# Patient Record
Sex: Female | Born: 1980 | Race: Black or African American | Hispanic: No | Marital: Single | Smoking: Never smoker
Health system: Southern US, Community
[De-identification: ages and names within clinical notes are randomized; demographics above are authoritative.]

## PROBLEM LIST (undated history)

## (undated) DIAGNOSIS — F419 Anxiety disorder, unspecified: Secondary | ICD-10-CM

## (undated) DIAGNOSIS — F410 Panic disorder [episodic paroxysmal anxiety] without agoraphobia: Secondary | ICD-10-CM

---
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
## 2001-09-30 ENCOUNTER — Encounter: Admission: RE | Admit: 2001-09-30 | Discharge: 2001-09-30 | Payer: Self-pay | Admitting: Sports Medicine

## 2002-04-06 ENCOUNTER — Emergency Department (HOSPITAL_COMMUNITY): Admission: EM | Admit: 2002-04-06 | Discharge: 2002-04-07 | Payer: Self-pay | Admitting: Emergency Medicine

## 2004-02-10 ENCOUNTER — Emergency Department (HOSPITAL_COMMUNITY): Admission: EM | Admit: 2004-02-10 | Discharge: 2004-02-10 | Payer: Self-pay

## 2005-01-25 ENCOUNTER — Emergency Department (HOSPITAL_COMMUNITY): Admission: EM | Admit: 2005-01-25 | Discharge: 2005-01-26 | Payer: Self-pay | Admitting: Emergency Medicine

## 2005-07-09 ENCOUNTER — Emergency Department (HOSPITAL_COMMUNITY): Admission: EM | Admit: 2005-07-09 | Discharge: 2005-07-09 | Payer: Self-pay | Admitting: Emergency Medicine

## 2005-07-12 ENCOUNTER — Emergency Department (HOSPITAL_COMMUNITY): Admission: EM | Admit: 2005-07-12 | Discharge: 2005-07-13 | Payer: Self-pay | Admitting: *Deleted

## 2005-11-07 ENCOUNTER — Emergency Department (HOSPITAL_COMMUNITY): Admission: EM | Admit: 2005-11-07 | Discharge: 2005-11-07 | Payer: Self-pay | Admitting: Emergency Medicine

## 2007-07-27 ENCOUNTER — Emergency Department (HOSPITAL_COMMUNITY): Admission: EM | Admit: 2007-07-27 | Discharge: 2007-07-27 | Payer: Self-pay | Admitting: Emergency Medicine

## 2008-06-17 ENCOUNTER — Emergency Department (HOSPITAL_COMMUNITY): Admission: EM | Admit: 2008-06-17 | Discharge: 2008-06-17 | Payer: Self-pay | Admitting: Emergency Medicine

## 2009-02-15 ENCOUNTER — Emergency Department (HOSPITAL_COMMUNITY): Admission: EM | Admit: 2009-02-15 | Discharge: 2009-02-15 | Payer: Self-pay | Admitting: Emergency Medicine

## 2009-02-17 ENCOUNTER — Inpatient Hospital Stay (HOSPITAL_COMMUNITY): Admission: AD | Admit: 2009-02-17 | Discharge: 2009-02-17 | Payer: Self-pay | Admitting: Obstetrics & Gynecology

## 2009-02-24 ENCOUNTER — Inpatient Hospital Stay (HOSPITAL_COMMUNITY): Admission: AD | Admit: 2009-02-24 | Discharge: 2009-02-24 | Payer: Self-pay | Admitting: Obstetrics & Gynecology

## 2009-02-26 ENCOUNTER — Inpatient Hospital Stay (HOSPITAL_COMMUNITY): Admission: AD | Admit: 2009-02-26 | Discharge: 2009-02-26 | Payer: Self-pay | Admitting: Obstetrics & Gynecology

## 2009-03-09 ENCOUNTER — Inpatient Hospital Stay (HOSPITAL_COMMUNITY): Admission: AD | Admit: 2009-03-09 | Discharge: 2009-03-09 | Payer: Self-pay | Admitting: Obstetrics & Gynecology

## 2009-03-30 ENCOUNTER — Encounter: Payer: Self-pay | Admitting: Obstetrics and Gynecology

## 2009-03-30 ENCOUNTER — Ambulatory Visit: Payer: Self-pay | Admitting: Obstetrics and Gynecology

## 2009-04-24 ENCOUNTER — Emergency Department (HOSPITAL_COMMUNITY): Admission: EM | Admit: 2009-04-24 | Discharge: 2009-04-24 | Payer: Self-pay | Admitting: Emergency Medicine

## 2009-09-06 ENCOUNTER — Emergency Department (HOSPITAL_COMMUNITY): Admission: EM | Admit: 2009-09-06 | Discharge: 2009-09-06 | Payer: Self-pay | Admitting: Emergency Medicine

## 2010-07-06 ENCOUNTER — Emergency Department (HOSPITAL_COMMUNITY): Admission: EM | Admit: 2010-07-06 | Discharge: 2009-10-07 | Payer: Self-pay | Admitting: Emergency Medicine

## 2010-07-30 ENCOUNTER — Emergency Department (HOSPITAL_COMMUNITY)
Admission: EM | Admit: 2010-07-30 | Discharge: 2010-07-31 | Payer: Self-pay | Source: Home / Self Care | Admitting: Emergency Medicine

## 2010-10-18 LAB — CBC
Hemoglobin: 12.8 g/dL (ref 12.0–15.0)
MCHC: 34.3 g/dL (ref 30.0–36.0)
RBC: 3.91 MIL/uL (ref 3.87–5.11)
RDW: 12.7 % (ref 11.5–15.5)

## 2010-10-18 LAB — POCT I-STAT, CHEM 8
BUN: 5 mg/dL — ABNORMAL LOW (ref 6–23)
Calcium, Ion: 1.13 mmol/L (ref 1.12–1.32)
Chloride: 107 mEq/L (ref 96–112)
HCT: 38 % (ref 36.0–46.0)
Potassium: 3.8 mEq/L (ref 3.5–5.1)

## 2010-10-18 LAB — URINALYSIS, ROUTINE W REFLEX MICROSCOPIC
Glucose, UA: NEGATIVE mg/dL
Ketones, ur: NEGATIVE mg/dL
Protein, ur: NEGATIVE mg/dL
pH: 5 (ref 5.0–8.0)

## 2010-10-18 LAB — DIFFERENTIAL
Basophils Absolute: 0 10*3/uL (ref 0.0–0.1)
Basophils Relative: 0 % (ref 0–1)
Lymphocytes Relative: 8 % — ABNORMAL LOW (ref 12–46)
Monocytes Absolute: 0.4 10*3/uL (ref 0.1–1.0)
Neutro Abs: 8.6 10*3/uL — ABNORMAL HIGH (ref 1.7–7.7)
Neutrophils Relative %: 87 % — ABNORMAL HIGH (ref 43–77)

## 2010-10-18 LAB — URINE MICROSCOPIC-ADD ON

## 2010-10-18 LAB — POCT PREGNANCY, URINE: Preg Test, Ur: NEGATIVE

## 2010-11-05 LAB — COMPREHENSIVE METABOLIC PANEL
Albumin: 3.5 g/dL (ref 3.5–5.2)
Alkaline Phosphatase: 51 U/L (ref 39–117)
BUN: 5 mg/dL — ABNORMAL LOW (ref 6–23)
CO2: 24 mEq/L (ref 19–32)
Chloride: 106 mEq/L (ref 96–112)
Creatinine, Ser: 0.61 mg/dL (ref 0.4–1.2)
GFR calc non Af Amer: >60 mL/min (ref >60)
Glucose, Bld: 92 mg/dL (ref 70–99)
Potassium: 3.4 mEq/L — ABNORMAL LOW (ref 3.5–5.1)
Total Bilirubin: 0.6 mg/dL (ref 0.3–1.2)

## 2010-11-05 LAB — URINE CULTURE: Colony Count: 7000

## 2010-11-05 LAB — HCG, QUANTITATIVE, PREGNANCY
hCG, Beta Chain, Quant, S: 297 m[IU]/mL — ABNORMAL HIGH (ref <5)
hCG, Beta Chain, Quant, S: 1114 m[IU]/mL — ABNORMAL HIGH (ref <5)
hCG, Beta Chain, Quant, S: 1155 m[IU]/mL — ABNORMAL HIGH (ref <5)
hCG, Beta Chain, Quant, S: 531 m[IU]/mL — ABNORMAL HIGH (ref <5)

## 2010-11-05 LAB — URINALYSIS, ROUTINE W REFLEX MICROSCOPIC
Nitrite: NEGATIVE
Protein, ur: NEGATIVE mg/dL
Urobilinogen, UA: 0.2 mg/dL (ref 0.0–1.0)

## 2010-11-05 LAB — CBC
HCT: 37.3 % (ref 36.0–46.0)
Hemoglobin: 12.7 g/dL (ref 12.0–15.0)
MCV: 94.7 fL (ref 78.0–100.0)
Platelets: 280 10*3/uL (ref 150–400)
RBC: 3.94 MIL/uL (ref 3.87–5.11)
RDW: 12.1 % (ref 11.5–15.5)
WBC: 6.3 10*3/uL (ref 4.0–10.5)
WBC: 8.8 10*3/uL (ref 4.0–10.5)

## 2010-11-05 LAB — DIFFERENTIAL
Basophils Absolute: 0 10*3/uL (ref 0.0–0.1)
Basophils Relative: 0 % (ref 0–1)
Lymphocytes Relative: 16 % (ref 12–46)
Monocytes Absolute: 0.8 10*3/uL (ref 0.1–1.0)
Neutro Abs: 4.2 10*3/uL (ref 1.7–7.7)
Neutrophils Relative %: 68 % (ref 43–77)

## 2010-11-05 LAB — URINE MICROSCOPIC-ADD ON

## 2010-11-05 LAB — LIPASE, BLOOD: Lipase: 16 U/L (ref 11–59)

## 2010-11-05 LAB — ABO/RH: ABO/RH(D): O POS

## 2010-11-05 LAB — WET PREP, GENITAL: Trich, Wet Prep: NONE SEEN

## 2010-12-12 NOTE — Group Therapy Note (Signed)
Traci Mccoy, PATTESON NO.:  [id number]   MEDICAL RECORD NO.:  [id number]          PATIENT TYPE:  WOC   LOCATION:  [facility]                   FACILITY:  [facility]   PHYSICIAN:  Argentina Donovan, MD        DATE OF BIRTH:  04/01/1981   DATE OF SERVICE:  03/30/2009                                  CLINIC NOTE   The patient is a 30 year old, gravida 1, para 0-0-1-0, who had a  spontaneous abortion on July 31 of this year had been followed for some  spotting and was approximately 4-1/[redacted] weeks gestation at that time.  Her  last beta-HCG was 1155 and she never had a followup.  She also pass good  tissue at home and never had a followup of her pathology.  She was not  on prenatal vitamins at the time.  I have talked to her about taking  them every day since she has refused contraception.  She has no plans  for getting pregnant in the near future,  but she does not want any  contraception and she says she does adjust the personal feeling that she  has.  We have talked about the ovarian cyst that she had which was done  on her first ultrasound was 6 cm and at last time it was followed up it  was sounded to some hemorrhagic cyst was probably and no reason to  follow that any further.   IMPRESSION:  Spontaneous abortion with a resolving ovarian cyst,  quantitative beta-HCG to be checked and the patient to start and stay on  permanently prenatal vitamins with at least 400 mcg of folic acid.           ______________________________  Argentina Donovan, MD     PR/MEDQ  D:  03/30/2009  T:  03/31/2009  Job:  [id number]

## 2011-05-01 LAB — URINALYSIS, ROUTINE W REFLEX MICROSCOPIC
Glucose, UA: NEGATIVE
Protein, ur: NEGATIVE
Specific Gravity, Urine: 1.016
Urobilinogen, UA: 0.2

## 2011-05-01 LAB — WET PREP, GENITAL: Clue Cells Wet Prep HPF POC: NONE SEEN

## 2011-05-01 LAB — URINE MICROSCOPIC-ADD ON

## 2011-05-01 LAB — GC/CHLAMYDIA PROBE AMP, GENITAL: Chlamydia, DNA Probe: NEGATIVE

## 2011-05-01 LAB — PREGNANCY, URINE: Preg Test, Ur: NEGATIVE

## 2011-05-01 LAB — RPR: RPR Ser Ql: NONREACTIVE

## 2011-11-10 ENCOUNTER — Emergency Department (HOSPITAL_COMMUNITY): Payer: 59

## 2011-11-10 ENCOUNTER — Emergency Department (HOSPITAL_COMMUNITY)
Admission: EM | Admit: 2011-11-10 | Discharge: 2011-11-10 | Disposition: A | Discharge status: Home / Self Care | Payer: 59 | Attending: Emergency Medicine | Admitting: Emergency Medicine

## 2011-11-10 ENCOUNTER — Encounter (HOSPITAL_COMMUNITY): Payer: Self-pay | Admitting: *Deleted

## 2011-11-10 DIAGNOSIS — R630 Anorexia: Secondary | ICD-10-CM | POA: Insufficient documentation

## 2011-11-10 DIAGNOSIS — F341 Dysthymic disorder: Secondary | ICD-10-CM | POA: Insufficient documentation

## 2011-11-10 DIAGNOSIS — R079 Chest pain, unspecified: Secondary | ICD-10-CM | POA: Insufficient documentation

## 2011-11-10 DIAGNOSIS — F419 Anxiety disorder, unspecified: Secondary | ICD-10-CM

## 2011-11-10 DIAGNOSIS — G4700 Insomnia, unspecified: Secondary | ICD-10-CM | POA: Insufficient documentation

## 2011-11-10 HISTORY — DX: Anxiety disorder, unspecified: F41.9

## 2011-11-10 HISTORY — DX: Panic disorder (episodic paroxysmal anxiety): F41.0

## 2011-11-10 MED ORDER — ALPRAZOLAM 0.25 MG PO TABS: 0.2500 mg | ORAL_TABLET | Freq: Every evening | ORAL | Status: AC | PRN

## 2011-11-10 NOTE — ED Provider Notes (Signed)
History     CSN: [id number]  Arrival date & time 11/10/11  1155   First MD Initiated Contact with Patient 11/10/11 1233      Chief Complaint  Patient presents with  . Panic Attack  . Depression    (Consider location/radiation/quality/duration/timing/severity/associated sxs/prior treatment) The history is provided by the patient.   Patient here with increased anxiety and chest pain x3 weeks. Chest pain is sharp and gets worse when she becomes more anxious. No pleuritic component to this. No leg swelling or dyspnea. Patient has been upset and due to the recent suicide of a friend and she feels responsible for this. She denies any suicidal or homicidal ideations. No auditory or visual hallucinations. No medications taken for this prior to arrival. Does admit to increased depression, anorexia, insomnia. No prior history of mental illness except for panic attacks Past Medical History  Diagnosis Date  . Anxiety   . Panic attacks   . Asthma   . Chronic bronchitis     History reviewed. No pertinent past surgical history.  History reviewed. No pertinent family history.  History  Substance Use Topics  . Smoking status: Never Smoker   . Smokeless tobacco: Never Used  . Alcohol Use: Yes     socially    OB History    Grav Para Term Preterm Abortions TAB SAB Ect Mult Living                  Review of Systems  All other systems reviewed and are negative.    Allergies  Food allergy formula  Home Medications   Current Outpatient Rx  Name Route Sig Dispense Refill  . DIPHENHYDRAMINE-APAP (SLEEP) 25-500 MG PO TABS Oral Take 1 tablet by mouth at bedtime as needed. Pain/sleep    . MULTIVITAMIN GUMMIES ADULT PO CHEW Oral Chew 1 tablet by mouth daily.      BP 100/64  Pulse 86  Temp(Src) 98.3 F (36.8 C) (Oral)  Resp 24  Ht 5\' 3"  (1.6 m)  Wt 135 lb (61.236 kg)  BMI 23.91 kg/m2  SpO2 100%  LMP 11/02/2011  Physical Exam  Nursing note and vitals  reviewed. Constitutional: She is oriented to person, place, and time. She appears well-developed and well-nourished.  Non-toxic appearance. No distress.  HENT:  Head: Normocephalic and atraumatic.  Eyes: Conjunctivae, EOM and lids are normal. Pupils are equal, round, and reactive to light.  Neck: Normal range of motion. Neck supple. No tracheal deviation present. No mass present.  Cardiovascular: Normal rate, regular rhythm and normal heart sounds.  Exam reveals no gallop.   No murmur heard. Pulmonary/Chest: Effort normal and breath sounds normal. No stridor. No respiratory distress. She has no decreased breath sounds. She has no wheezes. She has no rhonchi. She has no rales.  Abdominal: Soft. Normal appearance and bowel sounds are normal. She exhibits no distension. There is no tenderness. There is no rebound and no CVA tenderness.  Musculoskeletal: Normal range of motion. She exhibits no edema and no tenderness.  Neurological: She is alert and oriented to person, place, and time. She has normal strength. No cranial nerve deficit or sensory deficit. GCS eye subscore is 4. GCS verbal subscore is 5. GCS motor subscore is 6.  Skin: Skin is warm and dry. No abrasion and no rash noted.  Psychiatric: Her speech is normal. Her affect is blunt. She is slowed. Thought content is not paranoid and not delusional. She exhibits a depressed mood. She expresses no homicidal and  no suicidal ideation. She expresses no suicidal plans and no homicidal plans.    ED Course  Procedures (including critical care time)  Labs Reviewed - No data to display No results found.   No diagnosis found.    MDM  Pt seen by act and given outpt referrals --no si/hi here. Pt stable for d/c   Date: 11/10/2011  Rate: 86  Rhythm: normal sinus rhythm  QRS Axis: normal  Intervals: normal  ST/T Wave abnormalities: normal  Conduction Disutrbances:none  Narrative Interpretation:   Old EKG Reviewed: none  available          Toy Baker, MD 11/10/11 1401

## 2011-11-10 NOTE — BH Assessment (Signed)
Assessment Note   Traci Mccoy is an 31 y.o. female.   Pt is depressed and reports being "sad" about her female friend who spoke with her on the phone on 3/27 and said to pt "if you marry this other guy I will kill myself (per pt)."  Pt found out that this friend committed suicide on 3/27 after she and he hung up the phone.  Pt "feels" responsible but reports "I did not think he was serious and he gave me no impression he would actually do it.  I just thought he was making a point like some people do."  Pt verbalizes "feelings of guilt for my friend's death."  Pt denies any thoughts or intent to hurt self and denies hx of SI.  Pt denies family hx of SI.  Pt denies HI, AVH and reports she does not use alcohol or drugs.  Pt reports having a "lots of support and a good family."  Pt reports "my faith in God keeps me strong."  Pt is agreeable to optx referrals and pt was given referrals to [facility] and to [facility] in [place].  Pt reported she would follow up with these referrals on Monday and she would go to her parents house today after she left the ER.  Pt Ox3, eye contact was fair, insight was good, affect was flat and speech was soft but coherent.  Pt has no prior mental health hx per pt and works a FT job during the week.  Pt denied any abuse hx and verbalized she "feels safe and has lots of support in my life."  Pt and counselor discussed guilt feelings over her friends death and pt was able to put these feelings in their proper place and appeared to be able to advocate for herself effectively.    Pt will discharged home and Dr. Freida Busman is in agreement with dispo.  Axis I: Mood Disorder NOS Axis II: Deferred Axis III:  Past Medical History  Diagnosis Date  . Anxiety   . Panic attacks   . Asthma   . Chronic bronchitis    Axis IV: problems related to social environment Axis V: 51-60 moderate symptoms  Past Medical History:  Past Medical History  Diagnosis Date  . Anxiety    . Panic attacks   . Asthma   . Chronic bronchitis     History reviewed. No pertinent past surgical history.  Family History: History reviewed. No pertinent family history.  Social History:  reports that she has never smoked. She has never used smokeless tobacco. She reports that she drinks alcohol. She reports that she does not use illicit drugs.  Additional Social History:  Alcohol / Drug Use Pain Medications: none Prescriptions: none Over the Counter: none History of alcohol / drug use?: No history of alcohol / drug abuse Longest period of sobriety (when/how long): none Allergies:  Allergies  Allergen Reactions  . Food Allergy Formula Itching and Swelling    Fruits and vegetables    Home Medications:  No current facility-administered medications on file as of 11/10/2011.   Medications Prior to Admission  Medication Sig Dispense Refill  . diphenhydramine-acetaminophen (TYLENOL PM EXTRA STRENGTH) 25-500 MG TABS Take 1 tablet by mouth at bedtime as needed. Pain/sleep        OB/GYN Status:  Patient's last menstrual period was 11/02/2011.  General Assessment Data Location of Assessment: [facility] Living Arrangements: Alone Can pt return to current living arrangement?: Yes Admission Status: Voluntary Is  patient capable of signing voluntary admission?: Yes Transfer from: Acute Hospital Referral Source: MD     Risk to self Suicidal Ideation: No Suicidal Intent: No Is patient at risk for suicide?: No Suicidal Plan?: No Access to Means: No What has been your use of drugs/alcohol within the last 12 months?: no Previous Attempts/Gestures: No How many times?: 0  Other Self Harm Risks: 0 Triggers for Past Attempts: None known Intentional Self Injurious Behavior: None Family Suicide History: No Recent stressful life event(s): Trauma (Comment) (good friend committed suicide 3/27) Persecutory voices/beliefs?: No Depression: Yes Depression Symptoms:  Isolating;Fatigue;Guilt;Loss of interest in usual pleasures;Feeling worthless/self pity;Feeling angry/irritable Substance abuse history and/or treatment for substance abuse?: No Suicide prevention information given to non-admitted patients: Yes  Risk to Others Homicidal Ideation: No Thoughts of Harm to Others: No Current Homicidal Intent: No Current Homicidal Plan: No Access to Homicidal Means: No Identified Victim: 0 History of harm to others?: No Assessment of Violence: None Noted Violent Behavior Description: no Does patient have access to weapons?: No Criminal Charges Pending?: No Does patient have a court date: No  Psychosis Hallucinations: None noted Delusions: None noted  Mental Status Report Appear/Hygiene:  (pt casual) Eye Contact: Fair Motor Activity: Unremarkable Speech: Soft;Logical/coherent Level of Consciousness: Alert Mood: Depressed;Anxious;Preoccupied;Sad Affect: Anxious;Blunted;Depressed;Preoccupied;Sad Anxiety Level: Moderate Thought Processes: Coherent Judgement: Unimpaired Orientation: Person;Place;Time;Situation;Appropriate for developmental age Obsessive Compulsive Thoughts/Behaviors: None  Cognitive Functioning Concentration: Decreased Memory: Recent Intact;Remote Intact IQ: Average Insight: Fair Impulse Control: Fair Appetite: Poor Weight Loss: 10  Weight Gain: 0  Sleep: Decreased Total Hours of Sleep: 2  Vegetative Symptoms: None  Prior Inpatient Therapy Prior Inpatient Therapy: No Prior Therapy Dates: 0 Prior Therapy Facilty/Provider(s): 0 Reason for Treatment: 0  Prior Outpatient Therapy Prior Outpatient Therapy: No Prior Therapy Dates: 0 Prior Therapy Facilty/Provider(s): 0 Reason for Treatment: 0  ADL Screening (condition at time of admission) Patient's cognitive ability adequate to safely complete daily activities?: Yes Patient able to express need for assistance with ADLs?: Yes Independently performs ADLs?: Yes Weakness  of Legs: None Weakness of Arms/Hands: None  Home Assistive Devices/Equipment Home Assistive Devices/Equipment: None  Therapy Consults (therapy consults require a physician order) PT Evaluation Needed: No OT Evalulation Needed: No SLP Evaluation Needed: No Abuse/Neglect Assessment (Assessment to be complete while patient is alone) Physical Abuse: Denies Verbal Abuse: Denies Sexual Abuse: Denies Exploitation of patient/patient's resources: Denies Self-Neglect: Denies Values / Beliefs Cultural Requests During Hospitalization: None Spiritual Requests During Hospitalization: None Consults Spiritual Care Consult Needed: No Social Work Consult Needed: No Merchant navy officer (For Healthcare) Advance Directive: Patient does not have advance directive Pre-existing out of facility DNR order (yellow form or pink MOST form): No Nutrition Screen Diet: Regular Unintentional weight loss greater than 10lbs within the last month: Yes (Comment) (pt depressed and hasn't been eating well in 2 wks) Problems chewing or swallowing foods and/or liquids: No Home Tube Feeding or Total Parenteral Nutrition (TPN): No Patient appears severely malnourished: No Pregnant or Lactating: No  Additional Information 1:1 In Past 12 Months?: No CIRT Risk: No Elopement Risk: No Does patient have medical clearance?: Yes     Disposition:  Disposition Disposition of Patient: Outpatient treatment Type of outpatient treatment: Adult (recommend [facility] or [facility] counseling )  On Site Evaluation by:   Reviewed with Physician:     Titus Mould, Eppie Gibson 11/10/2011 2:31 PM

## 2011-11-10 NOTE — ED Notes (Addendum)
Pt from home with reports of feelings of depression, anxiety and sharp pains in chest off and on for 3 weeks. Pt also endorses having no appetite but denies cough, N/V/D. Pt tearful at present. Pt reports hx of panic attacks with similar feelings that she is reporting today. Pt reports that within the last 3 weeks a friend called her before they committed suicide.

## 2011-11-10 NOTE — Discharge Instructions (Signed)
Follow up as you have been instructed Anxiety and Panic Attacks Your caregiver has informed you that you are having an anxiety or panic attack. There may be many forms of this. Most of the time these attacks come suddenly and without warning. They come at any time of day, including periods of sleep, and at any time of life. They may be strong and unexplained. Although panic attacks are very scary, they are physically harmless. Sometimes the cause of your anxiety is not known. Anxiety is a protective mechanism of the body in its fight or flight mechanism. Most of these perceived danger situations are actually nonphysical situations (such as anxiety over losing a job). CAUSES  The causes of an anxiety or panic attack are many. Panic attacks may occur in otherwise healthy people given a certain set of circumstances. There may be a genetic cause for panic attacks. Some medications may also have anxiety as a side effect. SYMPTOMS  Some of the most common feelings are:  Intense terror.   Dizziness, feeling faint.   Hot and cold flashes.   Fear of going crazy.   Feelings that nothing is real.   Sweating.   Shaking.   Chest pain or a fast heartbeat (palpitations).   Smothering, choking sensations.   Feelings of impending doom and that death is near.   Tingling of extremities, this may be from over-breathing.   Altered reality (derealization).   Being detached from yourself (depersonalization).  Several symptoms can be present to make up anxiety or panic attacks. DIAGNOSIS  The evaluation by your caregiver will depend on the type of symptoms you are experiencing. The diagnosis of anxiety or panic attack is made when no physical illness can be determined to be a cause of the symptoms. TREATMENT  Treatment to prevent anxiety and panic attacks may include:  Avoidance of circumstances that cause anxiety.   Reassurance and relaxation.   Regular exercise.   Relaxation therapies, such as  yoga.   Psychotherapy with a psychiatrist or therapist.   Avoidance of caffeine, alcohol and illegal drugs.   Prescribed medication.  SEEK IMMEDIATE MEDICAL CARE IF:   You experience panic attack symptoms that are different than your usual symptoms.   You have any worsening or concerning symptoms.  Document Released: 07/16/2005 Document Revised: 07/05/2011 Document Reviewed: 11/17/2009 [facility] Patient Information 2012 [place], [place].

## 2012-01-21 ENCOUNTER — Emergency Department (HOSPITAL_COMMUNITY): Admission: EM | Admit: 2012-01-21 | Discharge: 2012-01-21 | Discharge status: Against Medical Advice | Payer: 59

## 2012-06-11 ENCOUNTER — Emergency Department (HOSPITAL_COMMUNITY): Payer: 59

## 2012-06-11 ENCOUNTER — Emergency Department (HOSPITAL_COMMUNITY)
Admission: EM | Admit: 2012-06-11 | Discharge: 2012-06-11 | Disposition: A | Discharge status: Home / Self Care | Payer: 59 | Attending: Emergency Medicine | Admitting: Emergency Medicine

## 2012-06-11 ENCOUNTER — Encounter (HOSPITAL_COMMUNITY): Payer: Self-pay | Admitting: Emergency Medicine

## 2012-06-11 DIAGNOSIS — Z8659 Personal history of other mental and behavioral disorders: Secondary | ICD-10-CM | POA: Insufficient documentation

## 2012-06-11 DIAGNOSIS — J4489 Other specified chronic obstructive pulmonary disease: Secondary | ICD-10-CM | POA: Insufficient documentation

## 2012-06-11 DIAGNOSIS — R1013 Epigastric pain: Secondary | ICD-10-CM | POA: Insufficient documentation

## 2012-06-11 DIAGNOSIS — R109 Unspecified abdominal pain: Secondary | ICD-10-CM

## 2012-06-11 DIAGNOSIS — J449 Chronic obstructive pulmonary disease, unspecified: Secondary | ICD-10-CM | POA: Insufficient documentation

## 2012-06-11 DIAGNOSIS — R42 Dizziness and giddiness: Secondary | ICD-10-CM | POA: Insufficient documentation

## 2012-06-11 LAB — URINALYSIS, ROUTINE W REFLEX MICROSCOPIC
Ketones, ur: NEGATIVE mg/dL
Leukocytes, UA: NEGATIVE
Nitrite: NEGATIVE
Specific Gravity, Urine: 1.023 (ref 1.005–1.030)
pH: 7 (ref 5.0–8.0)

## 2012-06-11 LAB — CBC WITH DIFFERENTIAL/PLATELET
Basophils Absolute: 0 10*3/uL (ref 0.0–0.1)
Basophils Relative: 0 % (ref 0–1)
MCHC: 34.3 g/dL (ref 30.0–36.0)
Neutro Abs: 4.1 10*3/uL (ref 1.7–7.7)
Neutrophils Relative %: 62 % (ref 43–77)
Platelets: 270 10*3/uL (ref 150–400)
RDW: 12.1 % (ref 11.5–15.5)
WBC: 6.6 10*3/uL (ref 4.0–10.5)

## 2012-06-11 LAB — HEPATIC FUNCTION PANEL
ALT: 12 U/L (ref 0–35)
AST: 17 U/L (ref 0–37)
Albumin: 4.6 g/dL (ref 3.5–5.2)
Alkaline Phosphatase: 68 U/L (ref 39–117)
Bilirubin, Direct: <0.1 mg/dL (ref 0.0–0.3)
Total Bilirubin: 0.4 mg/dL (ref 0.3–1.2)
Total Protein: 8.4 g/dL — ABNORMAL HIGH (ref 6.0–8.3)

## 2012-06-11 LAB — LIPASE, BLOOD: Lipase: 27 U/L (ref 11–59)

## 2012-06-11 LAB — BASIC METABOLIC PANEL
Chloride: 104 mEq/L (ref 96–112)
Creatinine, Ser: 0.63 mg/dL (ref 0.50–1.10)
GFR calc Af Amer: >90 mL/min (ref >90)
Potassium: 3.7 mEq/L (ref 3.5–5.1)
Sodium: 139 mEq/L (ref 135–145)

## 2012-06-11 LAB — URINE MICROSCOPIC-ADD ON

## 2012-06-11 MED ORDER — HYDROCODONE-ACETAMINOPHEN 5-325 MG PO TABS: 1.0000 | ORAL_TABLET | Freq: Four times a day (QID) | ORAL | Status: DC | PRN

## 2012-06-11 MED ORDER — SODIUM CHLORIDE 0.9 % IV BOLUS (SEPSIS)
1000.0000 mL | Freq: Once | INTRAVENOUS | Status: AC
  Administered 2012-06-11: 1000 mL via INTRAVENOUS

## 2012-06-11 NOTE — ED Provider Notes (Signed)
History     CSN: [id number]  Arrival date & time 06/11/12  1007   First MD Initiated Contact with Patient 06/11/12 1113      Chief Complaint  Patient presents with  . Dizziness  . Abdominal Pain    (Consider location/radiation/quality/duration/timing/severity/associated sxs/prior treatment) HPI Patient presents with abdominal pain mainly after eating. The patient states that she has epigastric pain after eating. The patient is stating that she took tylenol with some relief. The patient denies chest pain, SOB, vomiting, diarrhea, headache, fever, weakness, back pain, dysuria, or syncope. The patient is stating that this has been occuring for a while. The patient states that is has not been persistent.  Past Medical History  Diagnosis Date  . Anxiety   . Panic attacks   . Asthma   . Chronic bronchitis     History reviewed. No pertinent past surgical history.  History reviewed. No pertinent family history.  History  Substance Use Topics  . Smoking status: Never Smoker   . Smokeless tobacco: Never Used  . Alcohol Use: Yes     Comment: socially    OB History    Grav Para Term Preterm Abortions TAB SAB Ect Mult Living                  Review of Systems All other systems negative except as documented in the HPI. All pertinent positives and negatives as reviewed in the HPI.  Allergies  Food allergy formula  Home Medications   Current Outpatient Rx  Name  Route  Sig  Dispense  Refill  . ACETAMINOPHEN 325 MG PO TABS   Oral   Take 650-975 mg by mouth 2 (two) times daily as needed. For headaches         . SUMATRIPTAN SUCCINATE 25 MG PO TABS   Oral   Take 25 mg by mouth every 2 (two) hours as needed. For headaches           BP 138/86  Pulse 99  Temp 98.5 F (36.9 C) (Oral)  Resp 24  SpO2 99%  LMP 05/31/2012  Physical Exam  Nursing note and vitals reviewed. Constitutional: She appears well-developed and well-nourished.  HENT:  Head: Normocephalic and  atraumatic.  Mouth/Throat: Oropharynx is clear and moist.  Eyes: Pupils are equal, round, and reactive to light.  Neck: Normal range of motion. Neck supple.  Cardiovascular: Normal rate and regular rhythm.   Pulmonary/Chest: Effort normal and breath sounds normal.  Abdominal: Soft. Bowel sounds are normal. She exhibits no distension. There is tenderness. There is no rebound and no guarding.    Skin: Skin is warm and dry. No rash noted.    ED Course  Procedures (including critical care time)  Labs Reviewed  BASIC METABOLIC PANEL - Abnormal; Notable for the following:    Glucose, Bld 102 (*)     All other components within normal limits  URINALYSIS, ROUTINE W REFLEX MICROSCOPIC - Abnormal; Notable for the following:    Hgb urine dipstick MODERATE (*)     All other components within normal limits  URINE MICROSCOPIC-ADD ON - Abnormal; Notable for the following:    Squamous Epithelial / LPF FEW (*)     Bacteria, UA FEW (*)     All other components within normal limits  HEPATIC FUNCTION PANEL - Abnormal; Notable for the following:    Total Protein 8.4 (*)     All other components within normal limits  CBC WITH DIFFERENTIAL  POCT PREGNANCY, URINE  LIPASE, BLOOD   Ct Abdomen Pelvis Wo Contrast  06/11/2012  *RADIOLOGY REPORT*  Clinical Data: Abdominal pain  CT ABDOMEN AND PELVIS WITHOUT CONTRAST  Technique:  Multidetector CT imaging of the abdomen and pelvis was performed following the standard protocol without intravenous contrast.  Comparison: None.  Findings: Lung bases are clear.  No pleural or pericardial fluid. The liver has a normal appearance without focal lesions or biliary ductal dilatation.  There appears to be sludge within the gallbladder but no calcified stones.  No sign of gallbladder inflammation by CT.  The spleen is normal.  The pancreas is normal. The adrenal glands are normal.  The kidneys are normal.  No cyst, mass, stone or hydronephrosis.  The aorta and IVC are  normal.  No retroperitoneal mass or adenopathy.  No free but no fluid or air. Uterus and adnexal regions appear normal.  No significant bony finding.  IMPRESSION: Suggestion of sludge in the gallbladder.  Otherwise normal exam. No evidence of urinary tract pathology.   Original Report Authenticated By: Paulina Fusi, M.D.     Patient has signs of GB sludge on CT. The patient will have an [place]. The patient will be referred as needed.     MDM          Carlyle Dolly, PA-C 06/12/12 [phone]

## 2012-06-11 NOTE — ED Notes (Signed)
Pt c/o dizziness and abd pain x 3 days; pt appears very anxious at present; pt sts HA today as well

## 2012-06-11 NOTE — ED Notes (Signed)
Patient transported to CT 

## 2012-06-12 NOTE — ED Provider Notes (Signed)
Medical screening examination/treatment/procedure(s) were performed by non-physician practitioner and as supervising physician I was immediately available for consultation/collaboration.   Celene Kras, MD 06/12/12 [phone]

## 2012-07-12 ENCOUNTER — Encounter (HOSPITAL_COMMUNITY): Payer: Self-pay | Admitting: *Deleted

## 2012-07-12 ENCOUNTER — Emergency Department (HOSPITAL_COMMUNITY)
Admission: EM | Admit: 2012-07-12 | Discharge: 2012-07-12 | Disposition: A | Discharge status: Home / Self Care | Payer: 59 | Source: Home / Self Care | Attending: Emergency Medicine | Admitting: Emergency Medicine

## 2012-07-12 DIAGNOSIS — R6889 Other general symptoms and signs: Secondary | ICD-10-CM

## 2012-07-12 DIAGNOSIS — J111 Influenza due to unidentified influenza virus with other respiratory manifestations: Secondary | ICD-10-CM

## 2012-07-12 MED ORDER — ACETAMINOPHEN-CODEINE #3 300-30 MG PO TABS: 1.0000 | ORAL_TABLET | Freq: Four times a day (QID) | ORAL | Status: DC | PRN

## 2012-07-12 MED ORDER — OSELTAMIVIR PHOSPHATE 75 MG PO CAPS: 75.0000 mg | ORAL_CAPSULE | Freq: Two times a day (BID) | ORAL | Status: AC

## 2012-07-12 NOTE — ED Notes (Signed)
Vitals at 17:34 entered in error. Mw,cma

## 2012-07-12 NOTE — ED Notes (Signed)
Waiting discharge papers 

## 2012-07-12 NOTE — ED Notes (Signed)
Patient complains of body aches, head and chest congestion, fever/chills, sore throat and headaches x 1 day. Pt denies nausea, vomiting, diarrhea.

## 2012-07-12 NOTE — ED Provider Notes (Signed)
History     CSN: [id number]  Arrival date & time 07/12/12  1635   First MD Initiated Contact with Patient 07/12/12 1640      Chief Complaint  Patient presents with  . URI    (Consider location/radiation/quality/duration/timing/severity/associated sxs/prior treatment) HPI Comments: Patient presents as urgent care complaining of a one-day history of body aches, headaches cough mainly dry runny and congested nose. Some discomfort when she swallows and feeling rundown. 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 Denies any vomiting abdominal pain or nausea. Denies any shortness of breath. Or diarrheas  Patient is a 30 y.o. female presenting with URI. The history is provided by the patient.  URI The primary symptoms include fever, fatigue, headaches, sore throat, cough, myalgias and arthralgias. Primary symptoms do not include ear pain, wheezing, abdominal pain, vomiting or rash. The current episode started yesterday. This is a new problem.  The headache is not associated with neck stiffness.  The sore throat is not accompanied by stridor.  Symptoms associated with the illness include chills, congestion and rhinorrhea.    Past Medical History  Diagnosis Date  . Anxiety   . Panic attacks   . Asthma   . Chronic bronchitis     History reviewed. No pertinent past surgical history.  History reviewed. No pertinent family history.  History  Substance Use Topics  . Smoking status: Never Smoker   . Smokeless tobacco: Never Used  . Alcohol Use: Yes     Comment: socially    OB History    Grav Para Term Preterm Abortions TAB SAB Ect Mult Living                  Review of Systems  Constitutional: Positive for fever, chills, activity change and fatigue.  HENT: Positive for congestion, sore throat and rhinorrhea. Negative for ear pain, neck pain and neck stiffness.   Respiratory: Positive for cough. Negative for chest tightness, shortness of breath,  wheezing and stridor.   Gastrointestinal: Negative for vomiting and abdominal pain.  Musculoskeletal: Positive for myalgias and arthralgias.  Skin: Negative for rash.  Neurological: Positive for headaches. Negative for dizziness.    Allergies  Food allergy formula  Home Medications   Current Outpatient Rx  Name  Route  Sig  Dispense  Refill  . ACETAMINOPHEN 325 MG PO TABS   Oral   Take 650-975 mg by mouth 2 (two) times daily as needed. For headaches         . ACETAMINOPHEN-CODEINE #3 300-30 MG PO TABS   Oral   Take 1-2 tablets by mouth every 6 (six) hours as needed for pain.   15 tablet   0   . OSELTAMIVIR PHOSPHATE 75 MG PO CAPS   Oral   Take 1 capsule (75 mg total) by mouth every 12 (twelve) hours.   10 capsule   0   . SUMATRIPTAN SUCCINATE 25 MG PO TABS   Oral   Take 25 mg by mouth every 2 (two) hours as needed. For headaches           BP 123/82  Pulse 99  Temp 98.5 F (36.9 C) (Oral)  Resp 16  SpO2 100%  LMP 07/01/2012  Physical Exam  Vitals reviewed. Constitutional: She is oriented to person, place, and time. Vital signs are normal. She appears well-developed and well-nourished.  Non-toxic appearance. She does not have a sickly appearance. She appears ill. No distress.  HENT:  Head: Normocephalic.  Right Ear: Tympanic membrane normal.  Left Ear: Tympanic membrane normal.  Nose: Rhinorrhea present.  Mouth/Throat: Uvula is midline and mucous membranes are normal. Posterior oropharyngeal erythema present. No oropharyngeal exudate.  Eyes: Conjunctivae normal and EOM are normal. Pupils are equal, round, and reactive to light. Right eye exhibits no discharge. Left eye exhibits no discharge.  Neck: Normal range of motion. Neck supple. No JVD present.  Cardiovascular: Normal rate.  Exam reveals no gallop and no friction rub.   No murmur heard. Pulmonary/Chest: Effort normal and breath sounds normal. No respiratory distress. She has no wheezes. She has no  rales. She exhibits no tenderness.  Neurological: She is alert and oriented to person, place, and time.  Skin: No rash noted. No erythema.    ED Course  Procedures (including critical care time)   Labs Reviewed  POCT RAPID STREP A ([facility] ONLY)   No results found.   1. Influenza-like symptoms    Negative strep test   MDM  Influenza-like illness. Patient in no respiratory distress. Will propose Tamiflu for 5 days and Tylenol #3. Rest and hydrate. Discuss symptoms that should warrant her term further evaluation. She agrees with treatment plan and followup care.        Jimmie Molly, MD 07/12/12 1739

## 2013-06-04 ENCOUNTER — Other Ambulatory Visit: Payer: Self-pay

## 2013-07-16 ENCOUNTER — Encounter (HOSPITAL_COMMUNITY): Payer: Self-pay | Admitting: Emergency Medicine

## 2013-07-16 ENCOUNTER — Emergency Department (HOSPITAL_COMMUNITY)
Admission: EM | Admit: 2013-07-16 | Discharge: 2013-07-16 | Disposition: A | Discharge status: Home / Self Care | Payer: 59 | Source: Home / Self Care | Attending: Family Medicine | Admitting: Family Medicine

## 2013-07-16 DIAGNOSIS — J069 Acute upper respiratory infection, unspecified: Secondary | ICD-10-CM

## 2013-07-16 MED ORDER — IPRATROPIUM BROMIDE 0.06 % NA SOLN: 2.0000 | Freq: Four times a day (QID) | NASAL | Status: DC

## 2013-07-16 NOTE — ED Provider Notes (Signed)
CSN: [id number]     Arrival date & time 07/16/13  1637 History   First MD Initiated Contact with Patient 07/16/13 1753     Chief Complaint  Patient presents with  . URI   (Consider location/radiation/quality/duration/timing/severity/associated sxs/prior Treatment) Patient is a 32 y.o. female presenting with URI. The history is provided by the patient.  URI Presenting symptoms: congestion, cough and rhinorrhea   Presenting symptoms: no facial pain, no fever and no sore throat   Severity:  Mild Onset quality:  Gradual Duration:  2 weeks Progression:  Unchanged Chronicity:  New Ineffective treatments:  OTC medications Associated symptoms: no sinus pain and no wheezing   Risk factors: recent travel and sick contacts     Past Medical History  Diagnosis Date  . Anxiety   . Panic attacks   . Asthma   . Chronic bronchitis    History reviewed. No pertinent past surgical history. No family history on file. History  Substance Use Topics  . Smoking status: Never Smoker   . Smokeless tobacco: Never Used  . Alcohol Use: Yes     Comment: socially   OB History   Grav Para Term Preterm Abortions TAB SAB Ect Mult Living                 Review of Systems  Constitutional: Negative.  Negative for fever.  HENT: Positive for congestion and rhinorrhea. Negative for sinus pressure and sore throat.   Respiratory: Positive for cough. Negative for wheezing.     Allergies  Food allergy formula  Home Medications   Current Outpatient Rx  Name  Route  Sig  Dispense  Refill  . dextromethorphan-guaiFENesin (MUCINEX DM) 30-600 MG per 12 hr tablet   Oral   Take 1 tablet by mouth 2 (two) times daily.         Marland Kitchen acetaminophen (TYLENOL) 325 MG tablet   Oral   Take 650-975 mg by mouth 2 (two) times daily as needed. For headaches         . acetaminophen-codeine (TYLENOL #3) 300-30 MG per tablet   Oral   Take 1-2 tablets by mouth every 6 (six) hours as needed for pain.   15 tablet   0   . ipratropium (ATROVENT) 0.06 % nasal spray   Nasal   Place 2 sprays into the nose 4 (four) times daily.   15 mL   1   . SUMAtriptan (IMITREX) 25 MG tablet   Oral   Take 25 mg by mouth every 2 (two) hours as needed. For headaches          LMP 06/23/2013 Physical Exam  Nursing note and vitals reviewed. Constitutional: She is oriented to person, place, and time. She appears well-developed and well-nourished.  HENT:  Head: Normocephalic.  Right Ear: External ear normal.  Left Ear: External ear normal.  Nose: Nose normal.  Mouth/Throat: Oropharynx is clear and moist.  Eyes: Conjunctivae are normal. Pupils are equal, round, and reactive to light.  Neck: Normal range of motion. Neck supple.  Cardiovascular: Normal rate and regular rhythm.   Pulmonary/Chest: Breath sounds normal.  Lymphadenopathy:    She has no cervical adenopathy.  Neurological: She is alert and oriented to person, place, and time.  Skin: Skin is warm and dry.    ED Course  Procedures (including critical care time) Labs Review Labs Reviewed - No data to display Imaging Review No results found.  EKG Interpretation    Date/Time:    Ventricular Rate:  PR Interval:    QRS Duration:   QT Interval:    QTC Calculation:   R Axis:     Text Interpretation:              MDM      Linna Hoff, MD 07/16/13 1806

## 2013-07-16 NOTE — ED Notes (Signed)
Nasal stuffiness and runny nose, cough, onset 12/6.  Saw pcp and was prescribed antibiotic-took 5 days and stopped meds.  Has been to [place] and returned to [place].  No improvement in symptoms

## 2013-08-10 ENCOUNTER — Encounter (HOSPITAL_COMMUNITY): Payer: Self-pay | Admitting: Emergency Medicine

## 2013-08-10 ENCOUNTER — Emergency Department (HOSPITAL_COMMUNITY)
Admission: EM | Admit: 2013-08-10 | Discharge: 2013-08-10 | Disposition: A | Discharge status: Home / Self Care | Payer: 59 | Source: Home / Self Care | Attending: Family Medicine | Admitting: Family Medicine

## 2013-08-10 DIAGNOSIS — N390 Urinary tract infection, site not specified: Secondary | ICD-10-CM

## 2013-08-10 LAB — POCT URINALYSIS DIP (DEVICE)
BILIRUBIN URINE: NEGATIVE
GLUCOSE, UA: NEGATIVE mg/dL
Ketones, ur: NEGATIVE mg/dL
NITRITE: POSITIVE — AB
Protein, ur: 30 mg/dL — AB
Specific Gravity, Urine: >=1.03 (ref 1.005–1.030)
UROBILINOGEN UA: 0.2 mg/dL (ref 0.0–1.0)
pH: 6 (ref 5.0–8.0)

## 2013-08-10 LAB — POCT PREGNANCY, URINE: Preg Test, Ur: NEGATIVE

## 2013-08-10 MED ORDER — CEPHALEXIN 500 MG PO CAPS: 500.0000 mg | ORAL_CAPSULE | Freq: Four times a day (QID) | ORAL | Status: DC

## 2013-08-10 MED ORDER — FLUCONAZOLE 150 MG PO TABS: 150.0000 mg | ORAL_TABLET | Freq: Once | ORAL | Status: DC

## 2013-08-10 NOTE — ED Notes (Signed)
C/o poss UTI recurrence

## 2013-08-10 NOTE — ED Provider Notes (Signed)
CSN: [id number]     Arrival date & time 08/10/13  1726 History   First MD Initiated Contact with Patient 08/10/13 1842     Chief Complaint  Patient presents with  . Urinary Tract Infection   (Consider location/radiation/quality/duration/timing/severity/associated sxs/prior Treatment) Patient is a 33 y.o. female presenting with urinary tract infection. The history is provided by the patient.  Urinary Tract Infection This is a new problem. The current episode started yesterday. The problem has been gradually worsening. Pertinent negatives include no chest pain and no abdominal pain.    Past Medical History  Diagnosis Date  . Anxiety   . Panic attacks   . Asthma   . Chronic bronchitis    History reviewed. No pertinent past surgical history. History reviewed. No pertinent family history. History  Substance Use Topics  . Smoking status: Never Smoker   . Smokeless tobacco: Never Used  . Alcohol Use: Yes     Comment: socially   OB History   Grav Para Term Preterm Abortions TAB SAB Ect Mult Living                 Review of Systems  Constitutional: Negative.  Negative for fever.  Cardiovascular: Negative for chest pain.  Gastrointestinal: Negative.  Negative for abdominal pain.  Genitourinary: Positive for dysuria, urgency and frequency.    Allergies  Food allergy formula  Home Medications   Current Outpatient Rx  Name  Route  Sig  Dispense  Refill  . acetaminophen (TYLENOL) 325 MG tablet   Oral   Take 650-975 mg by mouth 2 (two) times daily as needed. For headaches         . acetaminophen-codeine (TYLENOL #3) 300-30 MG per tablet   Oral   Take 1-2 tablets by mouth every 6 (six) hours as needed for pain.   15 tablet   0   . cephALEXin (KEFLEX) 500 MG capsule   Oral   Take 1 capsule (500 mg total) by mouth 4 (four) times daily. Take all of medicine and drink lots of fluids   20 capsule   0   . dextromethorphan-guaiFENesin (MUCINEX DM) 30-600 MG per 12 hr  tablet   Oral   Take 1 tablet by mouth 2 (two) times daily.         . fluconazole (DIFLUCAN) 150 MG tablet   Oral   Take 1 tablet (150 mg total) by mouth once. As needed for yeast infection   1 tablet   1   . ipratropium (ATROVENT) 0.06 % nasal spray   Nasal   Place 2 sprays into the nose 4 (four) times daily.   15 mL   1   . SUMAtriptan (IMITREX) 25 MG tablet   Oral   Take 25 mg by mouth every 2 (two) hours as needed. For headaches          BP 108/58  Pulse 81  Temp(Src) 98 F (36.7 C) (Oral)  Resp 16  SpO2 100%  LMP 06/23/2013 Physical Exam  Nursing note and vitals reviewed. Constitutional: She is oriented to person, place, and time. She appears well-developed and well-nourished. No distress.  Abdominal: Soft. Bowel sounds are normal. She exhibits no distension and no mass. There is tenderness in the suprapubic area. There is no rigidity, no rebound and no guarding.  Neurological: She is alert and oriented to person, place, and time.  Skin: Skin is warm and dry.    ED Course  Procedures (including critical care time) Labs  Review Labs Reviewed  POCT URINALYSIS DIP (DEVICE) - Abnormal; Notable for the following:    Hgb urine dipstick LARGE (*)    Protein, ur 30 (*)    Nitrite POSITIVE (*)    Leukocytes, UA SMALL (*)    All other components within normal limits  POCT PREGNANCY, URINE   Imaging Review No results found.  EKG Interpretation    Date/Time:    Ventricular Rate:    PR Interval:    QRS Duration:   QT Interval:    QTC Calculation:   R Axis:     Text Interpretation:              MDM  U/a abnl.    Linna Hoff, MD 08/10/13 Jerene Bears

## 2014-08-10 ENCOUNTER — Emergency Department (HOSPITAL_COMMUNITY)
Admission: EM | Admit: 2014-08-10 | Discharge: 2014-08-10 | Disposition: A | Discharge status: Home / Self Care | Payer: 59 | Attending: Emergency Medicine | Admitting: Emergency Medicine

## 2014-08-10 ENCOUNTER — Emergency Department (HOSPITAL_COMMUNITY): Payer: 59

## 2014-08-10 DIAGNOSIS — J45909 Unspecified asthma, uncomplicated: Secondary | ICD-10-CM | POA: Diagnosis not present

## 2014-08-10 DIAGNOSIS — Z3202 Encounter for pregnancy test, result negative: Secondary | ICD-10-CM | POA: Insufficient documentation

## 2014-08-10 DIAGNOSIS — Z8659 Personal history of other mental and behavioral disorders: Secondary | ICD-10-CM | POA: Diagnosis not present

## 2014-08-10 DIAGNOSIS — G8929 Other chronic pain: Secondary | ICD-10-CM | POA: Insufficient documentation

## 2014-08-10 DIAGNOSIS — Z792 Long term (current) use of antibiotics: Secondary | ICD-10-CM | POA: Diagnosis not present

## 2014-08-10 DIAGNOSIS — Z8742 Personal history of other diseases of the female genital tract: Secondary | ICD-10-CM | POA: Diagnosis not present

## 2014-08-10 DIAGNOSIS — R102 Pelvic and perineal pain: Secondary | ICD-10-CM | POA: Diagnosis not present

## 2014-08-10 DIAGNOSIS — R109 Unspecified abdominal pain: Secondary | ICD-10-CM | POA: Diagnosis present

## 2014-08-10 LAB — COMPREHENSIVE METABOLIC PANEL
ALBUMIN: 4.2 g/dL (ref 3.5–5.2)
ALT: 20 U/L (ref 0–35)
AST: 21 U/L (ref 0–37)
Alkaline Phosphatase: 69 U/L (ref 39–117)
Anion gap: 6 (ref 5–15)
BUN: 8 mg/dL (ref 6–23)
CO2: 24 mmol/L (ref 19–32)
Calcium: 9.2 mg/dL (ref 8.4–10.5)
Chloride: 106 mEq/L (ref 96–112)
Creatinine, Ser: 0.72 mg/dL (ref 0.50–1.10)
GFR calc Af Amer: >90 mL/min (ref >90)
GFR calc non Af Amer: >90 mL/min (ref >90)
Glucose, Bld: 98 mg/dL (ref 70–99)
Potassium: 3.8 mmol/L (ref 3.5–5.1)
SODIUM: 136 mmol/L (ref 135–145)
TOTAL PROTEIN: 7.7 g/dL (ref 6.0–8.3)
Total Bilirubin: 0.6 mg/dL (ref 0.3–1.2)

## 2014-08-10 LAB — CBC WITH DIFFERENTIAL/PLATELET
BASOS ABS: 0 10*3/uL (ref 0.0–0.1)
Basophils Relative: 0 % (ref 0–1)
EOS ABS: 0.2 10*3/uL (ref 0.0–0.7)
EOS PCT: 2 % (ref 0–5)
HCT: 40.4 % (ref 36.0–46.0)
Hemoglobin: 13.7 g/dL (ref 12.0–15.0)
Lymphocytes Relative: 32 % (ref 12–46)
Lymphs Abs: 2.4 10*3/uL (ref 0.7–4.0)
MCH: 32.4 pg (ref 26.0–34.0)
MCHC: 33.9 g/dL (ref 30.0–36.0)
MCV: 95.5 fL (ref 78.0–100.0)
MONO ABS: 0.6 10*3/uL (ref 0.1–1.0)
Monocytes Relative: 8 % (ref 3–12)
Neutro Abs: 4.4 10*3/uL (ref 1.7–7.7)
Neutrophils Relative %: 58 % (ref 43–77)
Platelets: 281 10*3/uL (ref 150–400)
RBC: 4.23 MIL/uL (ref 3.87–5.11)
RDW: 12.5 % (ref 11.5–15.5)
WBC: 7.6 10*3/uL (ref 4.0–10.5)

## 2014-08-10 LAB — URINE MICROSCOPIC-ADD ON

## 2014-08-10 LAB — WET PREP, GENITAL
Clue Cells Wet Prep HPF POC: NONE SEEN
TRICH WET PREP: NONE SEEN
Yeast Wet Prep HPF POC: NONE SEEN

## 2014-08-10 LAB — URINALYSIS, ROUTINE W REFLEX MICROSCOPIC
Bilirubin Urine: NEGATIVE
Glucose, UA: NEGATIVE mg/dL
KETONES UR: NEGATIVE mg/dL
Leukocytes, UA: NEGATIVE
NITRITE: NEGATIVE
PROTEIN: NEGATIVE mg/dL
Specific Gravity, Urine: 1.024 (ref 1.005–1.030)
UROBILINOGEN UA: 0.2 mg/dL (ref 0.0–1.0)
pH: 6 (ref 5.0–8.0)

## 2014-08-10 LAB — POC URINE PREG, ED: PREG TEST UR: NEGATIVE

## 2014-08-10 MED ORDER — TRAMADOL HCL 50 MG PO TABS: 50.0000 mg | ORAL_TABLET | Freq: Four times a day (QID) | ORAL | Status: DC | PRN

## 2014-08-10 NOTE — ED Provider Notes (Signed)
CSN: [id number]     Arrival date & time 08/10/14  1727 History   First MD Initiated Contact with Patient 08/10/14 1836     Chief Complaint  Patient presents with  . Abdominal Pain     (Consider location/radiation/quality/duration/timing/severity/associated sxs/prior Treatment) HPI Comments: Patient with history of chronic pelvic pain, previous ovarian cysts, presents with worse pain over past several months. Pain is sharp, non-radiating. It switches sides but is on both sides. No treatments prior to arrival. Last menstrual period was one month ago, normal for patient in duration and amount. No current vaginal discharge. She has had ultrasounds in the past. Recent Pap smear in 05/2014. No dysuria or back pain. No fever, diarrhea, nausea or vomiting. The course is constant. Aggravating factors: none. Alleviating factors: none.     Patient is a 34 y.o. female presenting with abdominal pain. The history is provided by the patient and medical records.  Abdominal Pain Associated symptoms: no chest pain, no cough, no diarrhea, no dysuria, no fever, no nausea, no sore throat, no vaginal bleeding, no vaginal discharge and no vomiting     Past Medical History  Diagnosis Date  . Anxiety   . Panic attacks   . Asthma   . Chronic bronchitis    No past surgical history on file. No family history on file. History  Substance Use Topics  . Smoking status: Never Smoker   . Smokeless tobacco: Never Used  . Alcohol Use: Yes     Comment: socially   OB History    No data available     Review of Systems  Constitutional: Negative for fever.  HENT: Negative for rhinorrhea and sore throat.   Eyes: Negative for redness.  Respiratory: Negative for cough.   Cardiovascular: Negative for chest pain.  Gastrointestinal: Positive for abdominal pain. Negative for nausea, vomiting and diarrhea.  Genitourinary: Positive for pelvic pain. Negative for dysuria, vaginal bleeding and vaginal discharge.   Musculoskeletal: Negative for myalgias.  Skin: Negative for rash.  Neurological: Negative for headaches.    Allergies  Food allergy formula  Home Medications   Prior to Admission medications   Medication Sig Start Date End Date Taking? Authorizing Provider  acetaminophen (TYLENOL) 325 MG tablet Take 650-975 mg by mouth 2 (two) times daily as needed. For headaches    Historical Provider, MD  acetaminophen-codeine (TYLENOL #3) 300-30 MG per tablet Take 1-2 tablets by mouth every 6 (six) hours as needed for pain. 07/12/12   Jimmie Molly, MD  cephALEXin (KEFLEX) 500 MG capsule Take 1 capsule (500 mg total) by mouth 4 (four) times daily. Take all of medicine and drink lots of fluids 08/10/13   Linna Hoff, MD  dextromethorphan-guaiFENesin [facility] DM) 30-600 MG per 12 hr tablet Take 1 tablet by mouth 2 (two) times daily.    Historical Provider, MD  fluconazole (DIFLUCAN) 150 MG tablet Take 1 tablet (150 mg total) by mouth once. As needed for yeast infection 08/10/13   Linna Hoff, MD  ipratropium (ATROVENT) 0.06 % nasal spray Place 2 sprays into the nose 4 (four) times daily. 07/16/13   Linna Hoff, MD  SUMAtriptan (IMITREX) 25 MG tablet Take 25 mg by mouth every 2 (two) hours as needed. For headaches    Historical Provider, MD   BP 123/96 mmHg  Pulse 94  Temp(Src) 98.2 F (36.8 C) (Oral)  Resp 12  SpO2 100%   Physical Exam  Constitutional: She appears well-developed and well-nourished.  HENT:  Head: Normocephalic  and atraumatic.  Eyes: Conjunctivae are normal. Right eye exhibits no discharge. Left eye exhibits no discharge.  Neck: Normal range of motion. Neck supple.  Cardiovascular: Normal rate, regular rhythm and normal heart sounds.   Pulmonary/Chest: Effort normal and breath sounds normal.  Abdominal: Soft. There is no tenderness. There is no rebound, no guarding and no CVA tenderness.  Genitourinary: Uterus normal. There is no rash, tenderness or lesion on the right labia.  There is no rash, tenderness or lesion on the left labia. Cervix exhibits no motion tenderness, no discharge and no friability. Right adnexum displays tenderness (moderate). Right adnexum displays no mass. Left adnexum displays tenderness (moderate). Left adnexum displays no mass. No tenderness or bleeding in the vagina. Vaginal discharge (thin white) found.  Neurological: She is alert.  Skin: Skin is warm and dry.  Psychiatric: She has a normal mood and affect.  Nursing note and vitals reviewed.   ED Course  Procedures (including critical care time) Labs Review Labs Reviewed  WET PREP, GENITAL - Abnormal; Notable for the following:    WBC, Wet Prep HPF POC FEW (*)    All other components within normal limits  URINALYSIS, ROUTINE W REFLEX MICROSCOPIC - Abnormal; Notable for the following:    Hgb urine dipstick MODERATE (*)    All other components within normal limits  URINE MICROSCOPIC-ADD ON - Abnormal; Notable for the following:    Bacteria, UA FEW (*)    All other components within normal limits  GC/CHLAMYDIA PROBE AMP  CBC WITH DIFFERENTIAL  COMPREHENSIVE METABOLIC PANEL  POC URINE PREG, ED    Imaging Review US Transvaginal Non-ob  08/10/2014   CLINICAL DATA:  Pelvic pain in female.  EXAM: TRANSABDOMINAL AND TRANSVAGINAL ULTRASOUND OF PELVIS  TECHNIQUE: Both transabdominal and transvaginal ultrasound examinations of the pelvis were performed. Transabdominal technique was performed for global imaging of the pelvis including uterus, ovaries, adnexal regions, and pelvic cul-de-sac. It was necessary to proceed with endovaginal exam following the transabdominal exam to visualize the endometrium and ovaries.  COMPARISON:  None  FINDINGS: Uterus  Measurements: 6.3 x 3.6 x 3.4 cm. No fibroids or other mass visualized.  Endometrium  Thickness: 5.8 mm which is within normal limits. No focal abnormality visualized.  Right ovary  Measurements: 3.6 x 2.1 cm. Multiple follicular cysts are noted  with the largest measuring 1.6 cm.  Left ovary  Measurements: 4.5 x 2.7 x 2.6 cm. Multiple follicular cysts are noted with the largest measuring 1.4 cm.  Other findings  No free fluid.  IMPRESSION: No significant abnormality seen in the pelvis.   Electronically Signed   By: Roque Lias M.D.   On: 08/10/2014 21:20   US Pelvis Complete  08/10/2014   CLINICAL DATA:  Pelvic pain in female.  EXAM: TRANSABDOMINAL AND TRANSVAGINAL ULTRASOUND OF PELVIS  TECHNIQUE: Both transabdominal and transvaginal ultrasound examinations of the pelvis were performed. Transabdominal technique was performed for global imaging of the pelvis including uterus, ovaries, adnexal regions, and pelvic cul-de-sac. It was necessary to proceed with endovaginal exam following the transabdominal exam to visualize the endometrium and ovaries.  COMPARISON:  None  FINDINGS: Uterus  Measurements: 6.3 x 3.6 x 3.4 cm. No fibroids or other mass visualized.  Endometrium  Thickness: 5.8 mm which is within normal limits. No focal abnormality visualized.  Right ovary  Measurements: 3.6 x 2.1 cm. Multiple follicular cysts are noted with the largest measuring 1.6 cm.  Left ovary  Measurements: 4.5 x 2.7 x 2.6 cm. Multiple  follicular cysts are noted with the largest measuring 1.4 cm.  Other findings  No free fluid.  IMPRESSION: No significant abnormality seen in the pelvis.   Electronically Signed   By: Roque LiasJames  Green M.D.   On: 08/10/2014 21:20     EKG Interpretation None       6:53 PM Patient seen and examined. Work-up initiated. Pt declines pain medication.   Vital signs reviewed and are as follows: BP 123/96 mmHg  Pulse 94  Temp(Src) 98.2 F (36.8 C) (Oral)  Resp 12  SpO2 100%  7:31 PM Pelvic exam performed with NT chaperone. US ordered. Patient offered pain meds again, declines.  10:03 PM Patient informed of ultrasound results. Will discharge to home with tramadol and [facility] follow[facility].  The patient was urged to return to the  Emergency Department immediately with worsening of current symptoms, worsening abdominal pain, persistent vomiting, blood noted in stools, fever, or any other concerns. The patient verbalized understanding.   Patient counseled on use of narcotic pain medications. Counseled not to combine these medications with others containing tylenol. Urged not to drink alcohol, drive, or perform any other activities that requires focus while taking these medications. The patient verbalizes understanding and agrees with the plan.    MDM   Final diagnoses:  Pelvic pain in female   Patient with pelvic pain, bilaterally, negative urine pregnancy. Ultrasound shows several small follicular cysts but no large cysts. No concern for torsion given chronic nature of symptoms. No concern for PID or tubo-ovarian abscess. No fever. No right lower quadrant pain suspicious for appendicitis. Workup is otherwise unremarkable. GYN follow-up indicated with pain control at this time. Appropriate return instructions discussed with patient. She appears well, nontoxic. Will discharge home with appropriate follow-up.    Renne CriglerJoshua Female Minish, PA-C 08/10/14 2204  Toy CookeyMegan Docherty, MD 08/11/14 [phone]

## 2014-08-10 NOTE — Discharge Instructions (Signed)
Please read and follow all provided instructions.  Your diagnoses today include:  1. Pelvic pain in female     Tests performed today include:  Blood counts and electrolytes  Blood tests to check kidney function  Urine test to look for infection and pregnancy (in women)  Ultrasound - no signs of cysts or other problems  Vital signs. See below for your results today.    Medications prescribed:   Tramadol - narcotic-like pain medication  DO NOT drive or perform any activities that require you to be awake and alert because this medicine can make you drowsy.   Take any prescribed medications only as directed.  Home care instructions:   Follow any educational materials contained in this packet.  Follow-up instructions: Please follow-up with your primary care provider or the GYN listed in the next 3 days for further evaluation of your symptoms.    Return instructions:  SEEK IMMEDIATE MEDICAL ATTENTION IF:  The pain does not go away or becomes severe   A temperature above 101F develops   Repeated vomiting occurs (multiple episodes)   The pain becomes localized to portions of the abdomen. The right side could possibly be appendicitis. In an adult, the left lower portion of the abdomen could be colitis or diverticulitis.   Blood is being passed in stools or vomit (bright red or black tarry stools)   You develop chest pain, difficulty breathing, dizziness or fainting, or become confused, poorly responsive, or inconsolable (young children)  If you have any other emergent concerns regarding your health  Additional Information: Abdominal (belly) pain can be caused by many things. Your caregiver performed an examination and possibly ordered blood/urine tests and imaging (CT scan, x-rays, ultrasound). Many cases can be observed and treated at home after initial evaluation in the emergency department. Even though you are being discharged home, abdominal pain can be unpredictable.  Therefore, you need a repeated exam if your pain does not resolve, returns, or worsens. Most patients with abdominal pain don't have to be admitted to the hospital or have surgery, but serious problems like appendicitis and gallbladder attacks can start out as nonspecific pain. Many abdominal conditions cannot be diagnosed in one visit, so follow-up evaluations are very important.  Your vital signs today were: BP 103/64 mmHg   Pulse 79   Temp(Src) 98.2 F (36.8 C) (Oral)   Resp 18   SpO2 100% If your blood pressure (bp) was elevated above 135/85 this visit, please have this repeated by your doctor within one month. --------------

## 2014-08-10 NOTE — ED Notes (Signed)
Lower abd. Pain since 08/05/2014. Not on menses. Feels like sharp/dull pain. No vaginal drainage. No problems with urination.

## 2014-08-12 LAB — GC/CHLAMYDIA PROBE AMP
CT Probe RNA: NEGATIVE
GC PROBE AMP APTIMA: NEGATIVE

## 2015-07-27 ENCOUNTER — Other Ambulatory Visit: Payer: Self-pay | Admitting: Family Medicine

## 2015-07-27 DIAGNOSIS — N926 Irregular menstruation, unspecified: Secondary | ICD-10-CM

## 2015-07-28 ENCOUNTER — Ambulatory Visit
Admission: RE | Admit: 2015-07-28 | Discharge: 2015-07-28 | Disposition: A | Discharge status: Home / Self Care | Payer: 59 | Source: Ambulatory Visit | Attending: Family Medicine | Admitting: Family Medicine

## 2015-07-28 DIAGNOSIS — N926 Irregular menstruation, unspecified: Secondary | ICD-10-CM

## 2015-09-19 ENCOUNTER — Emergency Department (HOSPITAL_COMMUNITY)
Admission: EM | Admit: 2015-09-19 | Discharge: 2015-09-19 | Disposition: A | Discharge status: Home / Self Care | Payer: 59 | Attending: Emergency Medicine | Admitting: Emergency Medicine

## 2015-09-19 ENCOUNTER — Emergency Department (HOSPITAL_COMMUNITY): Payer: 59

## 2015-09-19 ENCOUNTER — Encounter (HOSPITAL_COMMUNITY): Payer: Self-pay | Admitting: Emergency Medicine

## 2015-09-19 DIAGNOSIS — J45901 Unspecified asthma with (acute) exacerbation: Secondary | ICD-10-CM | POA: Diagnosis not present

## 2015-09-19 DIAGNOSIS — F410 Panic disorder [episodic paroxysmal anxiety] without agoraphobia: Secondary | ICD-10-CM | POA: Diagnosis not present

## 2015-09-19 DIAGNOSIS — R0602 Shortness of breath: Secondary | ICD-10-CM | POA: Diagnosis present

## 2015-09-19 DIAGNOSIS — Z79899 Other long term (current) drug therapy: Secondary | ICD-10-CM | POA: Insufficient documentation

## 2015-09-19 DIAGNOSIS — J45990 Exercise induced bronchospasm: Secondary | ICD-10-CM

## 2015-09-19 DIAGNOSIS — Z792 Long term (current) use of antibiotics: Secondary | ICD-10-CM | POA: Insufficient documentation

## 2015-09-19 MED ORDER — ALBUTEROL SULFATE HFA 108 (90 BASE) MCG/ACT IN AERS
2.0000 | INHALATION_SPRAY | RESPIRATORY_TRACT | Status: DC | PRN
  Administered 2015-09-19: 2 via RESPIRATORY_TRACT
  Filled 2015-09-19: qty 6.7

## 2015-09-19 MED ORDER — ALBUTEROL SULFATE (2.5 MG/3ML) 0.083% IN NEBU: 5.0000 mg | INHALATION_SOLUTION | Freq: Once | RESPIRATORY_TRACT | Status: DC

## 2015-09-19 NOTE — Discharge Instructions (Signed)
Your symptom is likely due to exercise induce asthma.  Use albuterol 2 puffs every 4 hrs as needed for shortness of breath and wheezing.  Follow up with your doctor for further care.  Continue to gradually increase your exercise regimen.  Return to ER if you have any concerns.  Bronchospasm, Adult A bronchospasm is when the tubes that carry air in and out of your lungs (airways) spasm or tighten. During a bronchospasm it is hard to breathe. This is because the airways get smaller. A bronchospasm can be triggered by:  Allergies. These may be to animals, pollen, food, or mold.  Infection. This is a common cause of bronchospasm.  Exercise.  Irritants. These include pollution, cigarette smoke, strong odors, aerosol sprays, and paint fumes.  Weather changes.  Stress.  Being emotional. HOME CARE   Always have a plan for getting help. Know when to call your doctor and local emergency services (911 in the [place] Know where you can get emergency care.  Only take medicines as told by your doctor.  If you were prescribed an inhaler or nebulizer machine, ask your doctor how to use it correctly. Always use a spacer with your inhaler if you were given one.  Stay calm during an attack. Try to relax and breathe more slowly.  Control your home environment:  Change your heating and air conditioning filter at least once a month.  Limit your use of fireplaces and wood stoves.  Do not  smoke. Do not  allow smoking in your home.  Avoid perfumes and fragrances.  Get rid of pests (such as roaches and mice) and their droppings.  Throw away plants if you see mold on them.  Keep your house clean and dust free.  Replace carpet with wood, tile, or vinyl flooring. Carpet can trap dander and dust.  Use allergy-proof pillows, mattress covers, and box spring covers.  Wash bed sheets and blankets every week in hot water. Dry them in a dryer.  Use blankets that are made of polyester or cotton.  Wash  hands frequently. GET HELP IF:  You have muscle aches.  You have chest pain.  The thick spit you spit or cough up (sputum) changes from clear or white to yellow, green, gray, or bloody.  The thick spit you spit or cough up gets thicker.  There are problems that may be related to the medicine you are given such as:  A rash.  Itching.  Swelling.  Trouble breathing. GET HELP RIGHT AWAY IF:  You feel you cannot breathe or catch your breath.  You cannot stop coughing.  Your treatment is not helping you breathe better.  You have very bad chest pain. MAKE SURE YOU:   Understand these instructions.  Will watch your condition.  Will get help right away if you are not doing well or get worse.   This information is not intended to replace advice given to you by your health care provider. Make sure you discuss any questions you have with your health care provider.   Document Released: 05/13/2009 Document Revised: 08/06/2014 Document Reviewed: 01/06/2013 Elsevier Interactive Patient Education [employer]

## 2015-09-19 NOTE — ED Notes (Signed)
Patient was alert, oriented and stable upon discharge. RN went over AVS and patient had no further questions.  

## 2015-09-19 NOTE — ED Provider Notes (Signed)
CSN: [id number]     Arrival date & time 09/19/15  1739 History   First MD Initiated Contact with Patient 09/19/15 2004     Chief Complaint  Patient presents with  . Shortness of Breath     (Consider location/radiation/quality/duration/timing/severity/associated sxs/prior Treatment) HPI   35 year old female with history of asthma, chronic bronchitis, anxiety and panic attack who presents for evaluation of shortness of breath. Patient states she recently started to work out for the past 5 days including running. She mentioned after working out for an hour she developed tightness to the chests and subsequently wheezing. She endorse shortness of breath, lightheadedness, dizziness and felt very deconditioned. She admits to history of asthma in the past but without any complication since college but no prior ICU stay or intubation due to her asthma. She denies any prior history of PE or DVT, no recent surgery, prolonged bed rest, unilateral Calf pain, active cancer, or hemoptysis. At this time she denies having any active shortness of breath. She denies having fever, chills, productive cough, URI symptoms, abdominal pain or back pain. She is a nonsmoker. She denies any environmental changes aside from starting to workout.  Past Medical History  Diagnosis Date  . Anxiety   . Panic attacks   . Asthma   . Chronic bronchitis    History reviewed. No pertinent past surgical history. History reviewed. No pertinent family history. Social History  Substance Use Topics  . Smoking status: Never Smoker   . Smokeless tobacco: Never Used  . Alcohol Use: Yes     Comment: socially   OB History    No data available     Review of Systems  All other systems reviewed and are negative.     Allergies  Food allergy formula  Home Medications   Prior to Admission medications   Medication Sig Start Date End Date Taking? Authorizing Provider  acetaminophen (TYLENOL) 325 MG tablet Take 650-975 mg by  mouth 2 (two) times daily as needed. For headaches    Historical Provider, MD  acetaminophen-codeine (TYLENOL #3) 300-30 MG per tablet Take 1-2 tablets by mouth every 6 (six) hours as needed for pain. 07/12/12   Jimmie Molly, MD  cephALEXin (KEFLEX) 500 MG capsule Take 1 capsule (500 mg total) by mouth 4 (four) times daily. Take all of medicine and drink lots of fluids 08/10/13   Linna Hoff, MD  dextromethorphan-guaiFENesin [facility] DM) 30-600 MG per 12 hr tablet Take 1 tablet by mouth 2 (two) times daily.    Historical Provider, MD  fluconazole (DIFLUCAN) 150 MG tablet Take 1 tablet (150 mg total) by mouth once. As needed for yeast infection 08/10/13   Linna Hoff, MD  ipratropium (ATROVENT) 0.06 % nasal spray Place 2 sprays into the nose 4 (four) times daily. 07/16/13   Linna Hoff, MD  SUMAtriptan (IMITREX) 25 MG tablet Take 25 mg by mouth every 2 (two) hours as needed. For headaches    Historical Provider, MD  traMADol (ULTRAM) 50 MG tablet Take 1 tablet (50 mg total) by mouth every 6 (six) hours as needed. 08/10/14   Renne Crigler, PA-C   BP 122/78 mmHg  Pulse 95  Temp(Src) 98.1 F (36.7 C) (Oral)  Resp 16  SpO2 100%  LMP 09/15/2015 (Exact Date) Physical Exam  Constitutional: She appears well-developed and well-nourished. No distress.  HENT:  Head: Atraumatic.  Mouth/Throat: Oropharynx is clear and moist.  Eyes: Conjunctivae are normal.  Neck: Normal range of motion. Neck supple.  No JVD present. No tracheal deviation present.  Cardiovascular: Normal rate, regular rhythm and intact distal pulses.   Pulmonary/Chest: Effort normal and breath sounds normal. No respiratory distress. She has no wheezes. She has no rales. She exhibits no tenderness.  Abdominal: Soft. There is no tenderness.  Musculoskeletal: She exhibits no edema.  Bilateral lower extremity is without palpable cords, erythema, or edema  Neurological: She is alert.  Skin: No rash noted.  Psychiatric: She has a normal  mood and affect.  Nursing note and vitals reviewed.   ED Course  Procedures (including critical care time) Labs Review Labs Reviewed - No data to display  Imaging Review Dg Chest 2 View  09/19/2015  CLINICAL DATA:  Coughing and shortness of breath for 3 days EXAM: CHEST  2 VIEW COMPARISON:  11/10/2011 FINDINGS: The heart size and mediastinal contours are within normal limits. Both lungs are clear. The visualized skeletal structures are unremarkable. IMPRESSION: No active cardiopulmonary disease. Electronically Signed   By: Kennith Center M.D.   On: 09/19/2015 18:52   I have personally reviewed and evaluated these images and lab results as part of my medical decision-making.   EKG Interpretation None      MDM   Final diagnoses:  Mild exercise-induced asthma    BP 122/78 mmHg  Pulse 93  Temp(Src) 98.1 F (36.7 C) (Oral)  Resp 16  SpO2 100%  LMP 09/15/2015 (Exact Date)   8:17 PM Patient presents with complaints of shortness of breath and wheezing after exercising. She is in no acute respiratory discomfort at this time. I suspect this is exercise-induced asthma. She will be given an albuterol inhaler to use as needed. She is PERC negative, low suspicion for PE. This is atypical for ACS. No fever, hypoxia or productive cough to suggest pneumonia.  Fayrene Helper, PA-C 09/19/15 1610  Raeford Razor, MD 09/22/15 [phone]

## 2015-09-19 NOTE — ED Notes (Signed)
Pt states that she recently started working out again but has had sob when she has been working out to the point where she can't catch her breath. Alert and oriented. Still feels winded but not as SOB as before.

## 2015-12-11 ENCOUNTER — Emergency Department (HOSPITAL_COMMUNITY)
Admission: EM | Admit: 2015-12-11 | Discharge: 2015-12-11 | Disposition: A | Discharge status: Home / Self Care | Payer: 59 | Attending: Emergency Medicine | Admitting: Emergency Medicine

## 2015-12-11 ENCOUNTER — Encounter (HOSPITAL_COMMUNITY): Payer: Self-pay | Admitting: Family Medicine

## 2015-12-11 DIAGNOSIS — Z3202 Encounter for pregnancy test, result negative: Secondary | ICD-10-CM | POA: Diagnosis not present

## 2015-12-11 DIAGNOSIS — F419 Anxiety disorder, unspecified: Secondary | ICD-10-CM | POA: Diagnosis not present

## 2015-12-11 DIAGNOSIS — Z8659 Personal history of other mental and behavioral disorders: Secondary | ICD-10-CM | POA: Diagnosis not present

## 2015-12-11 DIAGNOSIS — J45909 Unspecified asthma, uncomplicated: Secondary | ICD-10-CM | POA: Insufficient documentation

## 2015-12-11 DIAGNOSIS — R55 Syncope and collapse: Secondary | ICD-10-CM

## 2015-12-11 DIAGNOSIS — Z79899 Other long term (current) drug therapy: Secondary | ICD-10-CM | POA: Insufficient documentation

## 2015-12-11 DIAGNOSIS — R200 Anesthesia of skin: Secondary | ICD-10-CM | POA: Insufficient documentation

## 2015-12-11 LAB — I-STAT CHEM 8, ED
BUN: 11 mg/dL (ref 6–20)
CHLORIDE: 105 mmol/L (ref 101–111)
CREATININE: 0.7 mg/dL (ref 0.44–1.00)
Calcium, Ion: 1.16 mmol/L (ref 1.12–1.23)
GLUCOSE: 84 mg/dL (ref 65–99)
HEMATOCRIT: 42 % (ref 36.0–46.0)
Hemoglobin: 14.3 g/dL (ref 12.0–15.0)
POTASSIUM: 3.5 mmol/L (ref 3.5–5.1)
Sodium: 140 mmol/L (ref 135–145)
TCO2: 19 mmol/L (ref 0–100)

## 2015-12-11 LAB — CBG MONITORING, ED: Glucose-Capillary: 80 mg/dL (ref 65–99)

## 2015-12-11 LAB — I-STAT BETA HCG BLOOD, ED (MC, WL, AP ONLY): I-stat hCG, quantitative: <5 m[IU]/mL (ref <5)

## 2015-12-11 MED ORDER — SODIUM CHLORIDE 0.9 % IV BOLUS (SEPSIS)
1000.0000 mL | Freq: Once | INTRAVENOUS | Status: AC
  Administered 2015-12-11: 1000 mL via INTRAVENOUS
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
## 2015-12-11 NOTE — Discharge Instructions (Signed)

## 2016-01-08 ENCOUNTER — Emergency Department (HOSPITAL_COMMUNITY)
Admission: EM | Admit: 2016-01-08 | Discharge: 2016-01-08 | Disposition: A | Discharge status: Home / Self Care | Payer: 59 | Attending: Emergency Medicine | Admitting: Emergency Medicine

## 2016-01-08 ENCOUNTER — Encounter (HOSPITAL_COMMUNITY): Payer: Self-pay | Admitting: Emergency Medicine

## 2016-01-08 DIAGNOSIS — N898 Other specified noninflammatory disorders of vagina: Secondary | ICD-10-CM | POA: Diagnosis present

## 2016-01-08 DIAGNOSIS — N760 Acute vaginitis: Secondary | ICD-10-CM | POA: Diagnosis not present

## 2016-01-08 DIAGNOSIS — B9689 Other specified bacterial agents as the cause of diseases classified elsewhere: Secondary | ICD-10-CM

## 2016-01-08 DIAGNOSIS — J45909 Unspecified asthma, uncomplicated: Secondary | ICD-10-CM | POA: Insufficient documentation

## 2016-01-08 DIAGNOSIS — Z79899 Other long term (current) drug therapy: Secondary | ICD-10-CM | POA: Insufficient documentation

## 2016-01-08 LAB — WET PREP, GENITAL
SPERM: NONE SEEN
Trich, Wet Prep: NONE SEEN
Yeast Wet Prep HPF POC: NONE SEEN

## 2016-01-08 MED ORDER — CLINDAMYCIN HCL 300 MG PO CAPS: 300.0000 mg | ORAL_CAPSULE | Freq: Two times a day (BID) | ORAL | Status: AC

## 2016-01-08 NOTE — ED Notes (Addendum)
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 Per patient, MD called her on phone and informed her that these were rare and serious and there was no treatment for them. Patient asked MD if she was going to die and MD stated "I don't know." Patient tearful, frightened, believes she has incurable illness that may kill her. This RN thoroughly explained bacterial vaginosis to patient, informed patient that vital signs are currently reassuring. Explained to patient that as RN this Clinical research associatewriter does not have education or experience needed to inform her of her diagnosis or condition and that she still needs to see provider. This RN also informed patient that without knowing for certain the results received at Urgent care, this RN cannot appropriately comment on or predict course of disease for patient. Patient appears more calm.

## 2016-01-08 NOTE — ED Provider Notes (Signed)
CSN: [id number]     Arrival date & time 01/08/16  1810 History   First MD Initiated Contact with Patient 01/08/16 2007     No chief complaint on file.   (Consider location/radiation/quality/duration/timing/severity/associated sxs/prior Treatment) The history is provided by the patient and medical records. No language interpreter was used.   Traci Mccoy is a 35 y.o. female  with a PMH of asthma, anxiety with panic attacks who presents to the Emergency Department complaining of vaginal odor with creamy white discharge that began on Tuesday (5 days ago). Patient states she was seen by urgent care at that time where she was started on doxycyline and cultures were taken. A doctor from the urgent care called her today and told her they found Atopibium vaginae and megasthaera in the samples collected. When she asked what that meant, physician told her he was unfamiliar with these bacteria. She then asked him if this was something that she could die from and he told her "i don't know". She then began googling the bacteria names and is worried she has an "infectious disease that could kill me". She denies constitutional symptoms including fever. No abdominal pain or dysuria.    Past Medical History  Diagnosis Date  . Anxiety   . Panic attacks   . Asthma   . Chronic bronchitis    History reviewed. No pertinent past surgical history. History reviewed. No pertinent family history. Social History  Substance Use Topics  . Smoking status: Never Smoker   . Smokeless tobacco: Never Used  . Alcohol Use: Yes     Comment: Once or twice a month.    OB History    No data available     Review of Systems  Constitutional: Negative for fever and chills.  HENT: Negative for congestion and sore throat.   Eyes: Negative for visual disturbance.  Respiratory: Negative for cough and shortness of breath.   Cardiovascular: Negative.   Gastrointestinal: Negative for nausea, vomiting and abdominal pain.   Genitourinary: Positive for vaginal discharge. Negative for dysuria.  Musculoskeletal: Negative for back pain and neck pain.  Skin: Negative for rash.  Neurological: Negative for dizziness, weakness and headaches.      Allergies  Food allergy formula  Home Medications   Prior to Admission medications   Medication Sig Start Date End Date Taking? Authorizing Provider  acetaminophen (TYLENOL) 500 MG tablet Take 1,000 mg by mouth every 6 (six) hours as needed for moderate pain or headache.   Yes Historical Provider, MD  FOLIC ACID PO Take 1 tablet by mouth daily.   Yes Historical Provider, MD  Multiple Vitamins-Minerals (MULTI ADULT GUMMIES PO) Take 2 tablets by mouth daily.   Yes Historical Provider, MD  topiramate (TOPAMAX) 25 MG tablet Take 50 mg by mouth daily as needed (migraines).  08/10/15  Yes Historical Provider, MD  clindamycin (CLEOCIN) 300 MG capsule Take 1 capsule (300 mg total) by mouth 2 (two) times daily with a meal. 01/08/16   Mortimer Bair Pilcher Yazlyn Wentzel, PA-C   BP 108/74 mmHg  Pulse 73  Temp(Src) 98.3 F (36.8 C) (Oral)  Resp 16  SpO2 99%  LMP 11/12/2015 Physical Exam  Constitutional: She is oriented to person, place, and time. She appears well-developed and well-nourished.  Alert, appears anxious, NAD.  HENT:  Head: Normocephalic and atraumatic.  Cardiovascular: Normal rate, regular rhythm and normal heart sounds.  Exam reveals no gallop and no friction rub.   No murmur heard. Pulmonary/Chest: Effort normal and breath sounds  normal. No respiratory distress. She has no wheezes. She has no rales. She exhibits no tenderness.  Abdominal: Soft. Bowel sounds are normal. She exhibits no distension and no mass. There is no tenderness. There is no rebound and no guarding.  Genitourinary:  Chaperone present for exam. + white discharge.  No rashes, lesions, or tenderness to external genitalia. No erythema, injury, or tenderness to vaginal mucosa. No bleeding within vaginal vault. No  adnexal masses, tenderness, or fullness. No CMT.  Neurological: She is alert and oriented to person, place, and time.  Skin: Skin is warm and dry.  Nursing note and vitals reviewed.   ED Course  Procedures (including critical care time) Labs Review Labs Reviewed  WET PREP, GENITAL - Abnormal; Notable for the following:    Clue Cells Wet Prep HPF POC PRESENT (*)    WBC, Wet Prep HPF POC MANY (*)    All other components within normal limits  GC/CHLAMYDIA PROBE AMP ([place]) NOT AT [facility]    Imaging Review No results found. I have personally reviewed and evaluated these images and lab results as part of my medical decision-making.   EKG Interpretation None      MDM   Final diagnoses:  BV (bacterial vaginosis)   Traci Mccoy presents to ED for vaginal discharge and odor x 5 days. Seen by urgent care and started on doxy (allergic to flagyl). UC called patient with culture results showing measthaera and atopibium vaginae, but did not tell her what those results meant. She is very anxious about these findings and worried that she has an incurable infectious disease and that she is going to die. Reassured patient. Will order wet prep and G&C. Offered HIV/RPR testing which she states she had done back in April and does not want to repeat. Wet prep with clue cells present. Allergic to flagyl - clinda appears to be best choice for bacteria seen on cx results. PCP or GYN follow up in 1 week after ABX completion, sooner if symptoms worsen. Return precautions given and all questions answered.  [facility] Pilcher Dim Meisinger, PA-C 01/08/16 2234  Nelva Nayobert Beaton, MD 01/11/16 [phone]

## 2016-01-08 NOTE — Discharge Instructions (Signed)
Please take all of your antibiotics until finished!   Follow up with your doctor, or OBGYN in regards to today's visit.   Please return to the ER for new or worsening symptoms    SEEK IMMEDIATE MEDICAL CARE IF:  You develop an oral temperature above 102 F (38.9 C), not controlled by medications or lasting more than 2 days.  You develop an increase in pain.  You develop vaginal bleeding and it is not time for your period.  You develop painful intercourse.

## 2016-01-09 LAB — GC/CHLAMYDIA PROBE AMP (~~LOC~~) NOT AT ARMC
Chlamydia: NEGATIVE
Neisseria Gonorrhea: NEGATIVE

## 2016-06-11 IMAGING — US US TRANSVAGINAL NON-OB
1 series · 14 of 25 positions shown · non-contrast
Comparison: None

CLINICAL DATA: Pelvic pain in female.

EXAM:
TRANSABDOMINAL AND TRANSVAGINAL ULTRASOUND OF PELVIS
TECHNIQUE: Both transabdominal and transvaginal ultrasound examinations of the
pelvis were performed. Transabdominal technique was performed for
global imaging of the pelvis including uterus, ovaries, adnexal
regions, and pelvic cul-de-sac. It was necessary to proceed with
endovaginal exam following the transabdominal exam to visualize the
endometrium and ovaries.

[Series 1: us transvaginal non-ob · 0.18mm/px · 45 acquisitions, 14 frames shown]
[im 1/45]
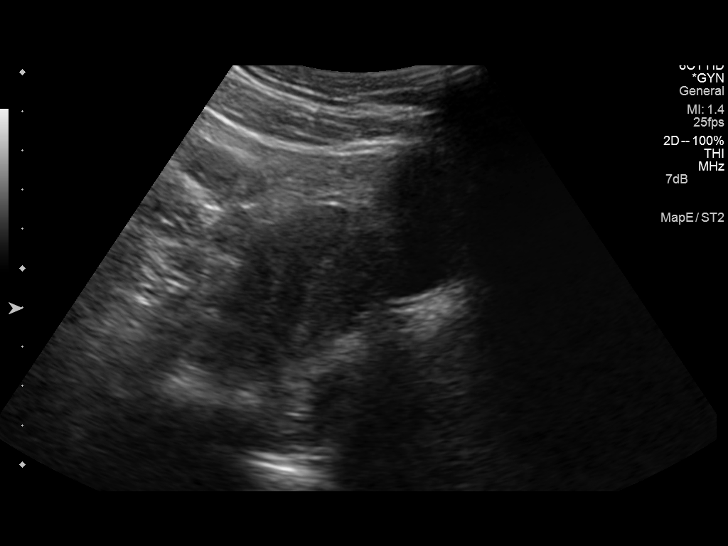
[im 4/45]
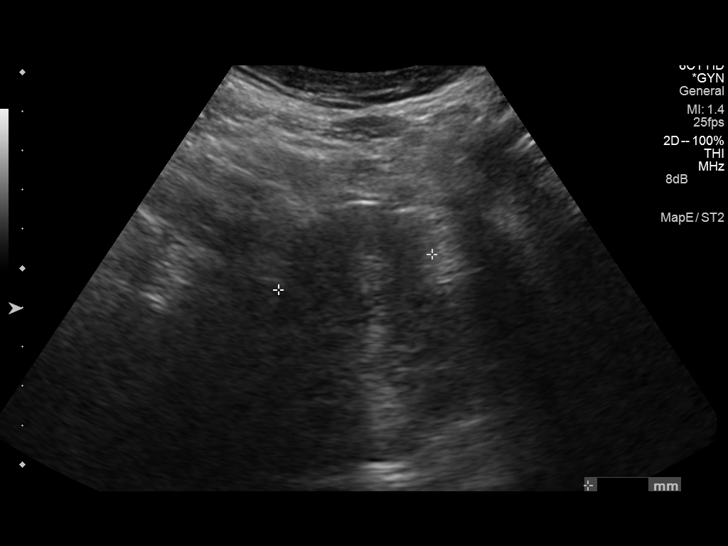
[im 8/45]
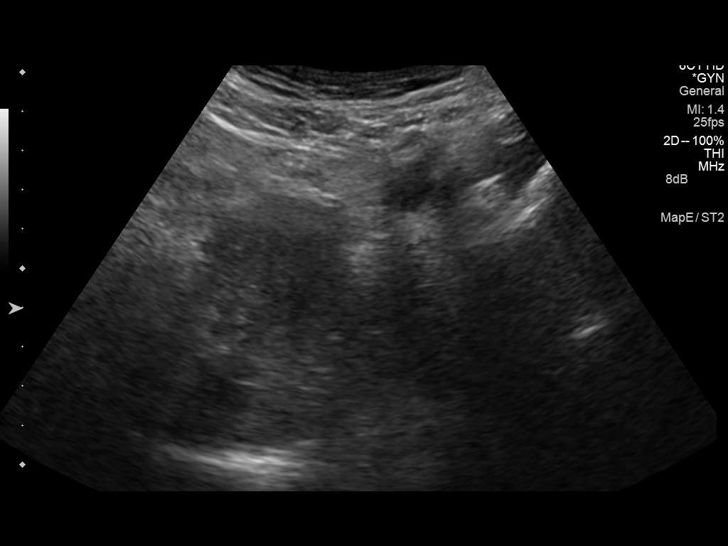
[im 12/45]
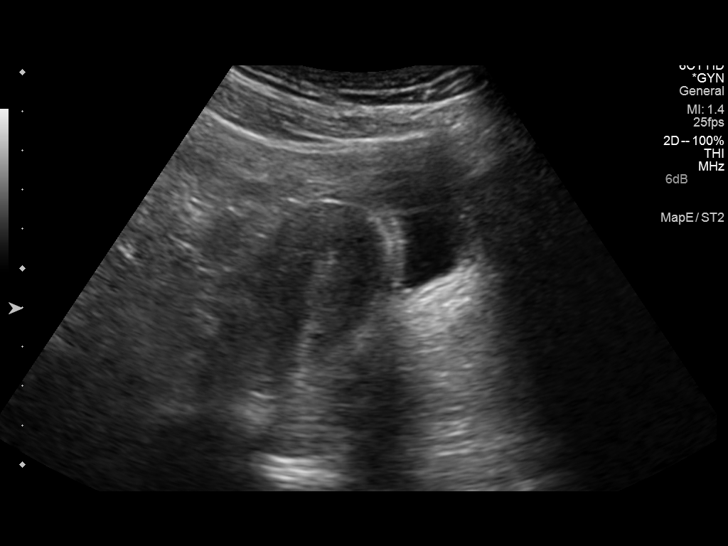
[im 15/45]
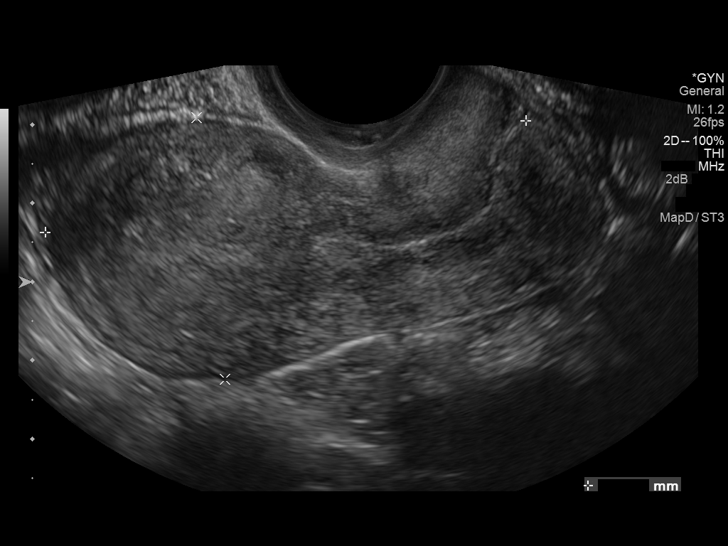
[im 17/45]
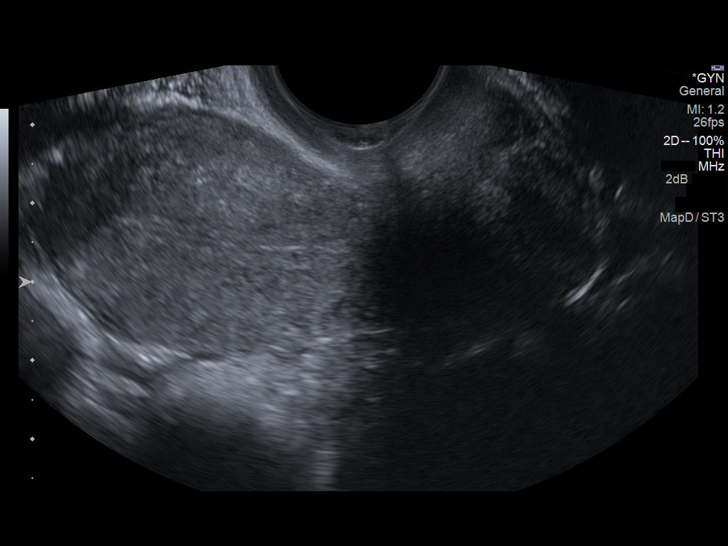
[im 21/45]
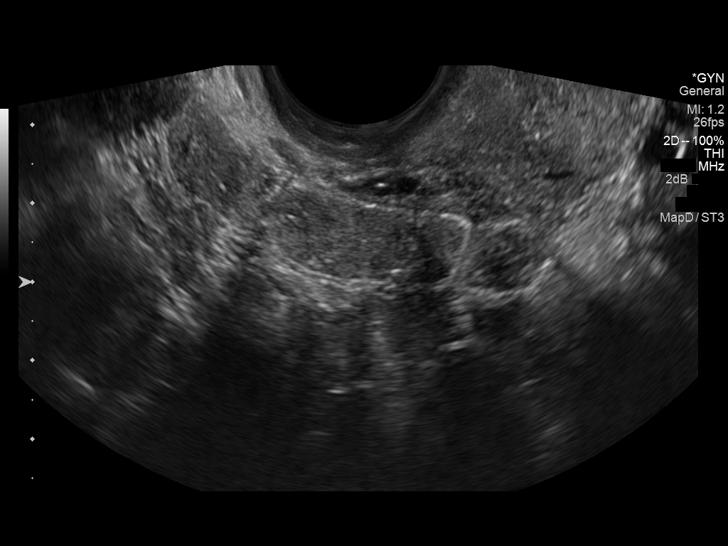
[im 24/45]
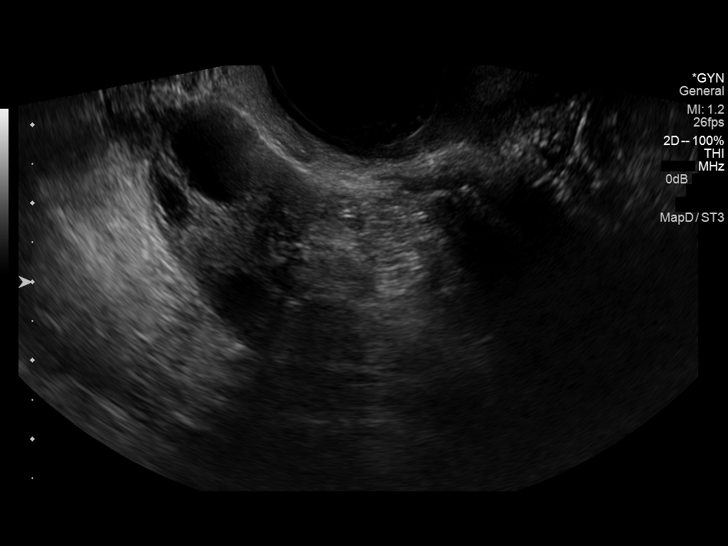
[im 28/45]
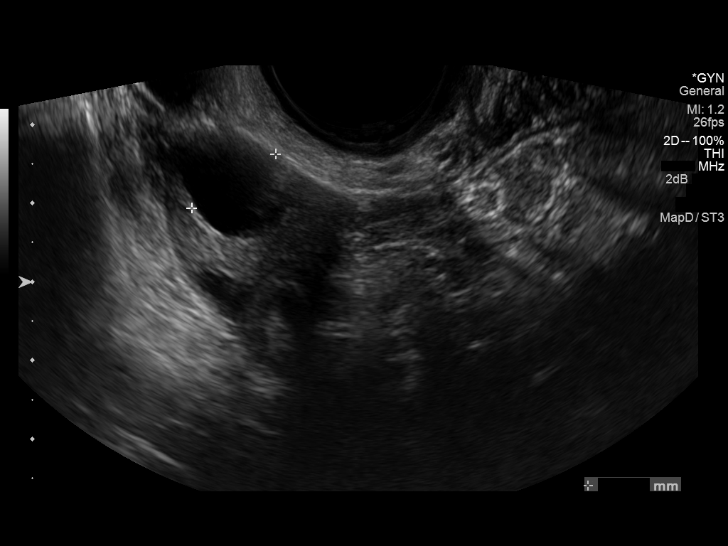
[im 30/45]
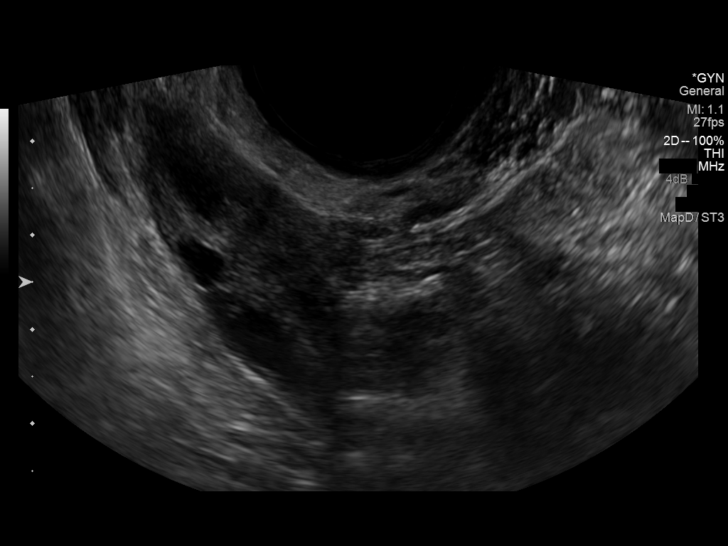
[im 34/45]
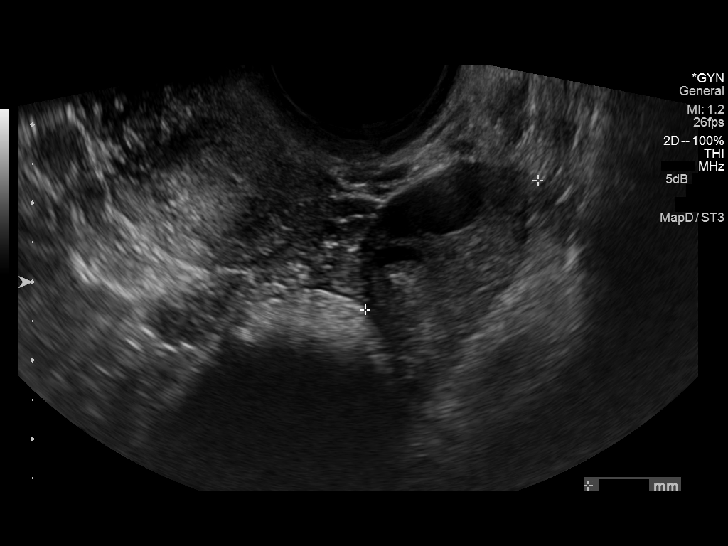
[im 37/45]
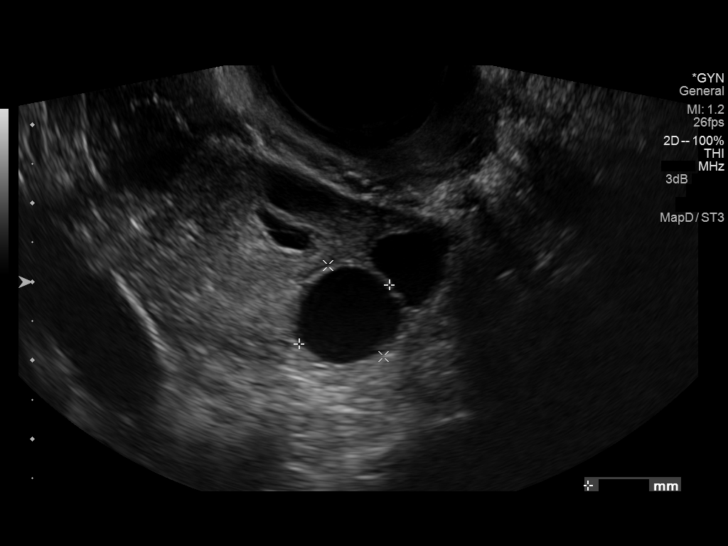
[im 41/45]
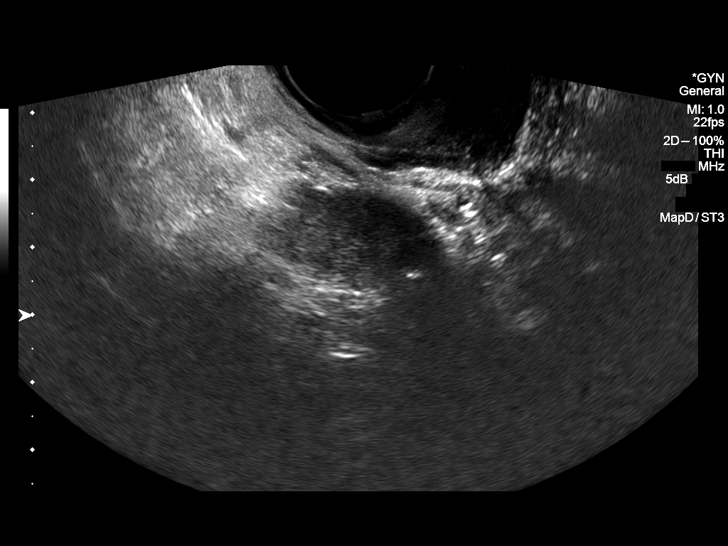
[im 45/45]
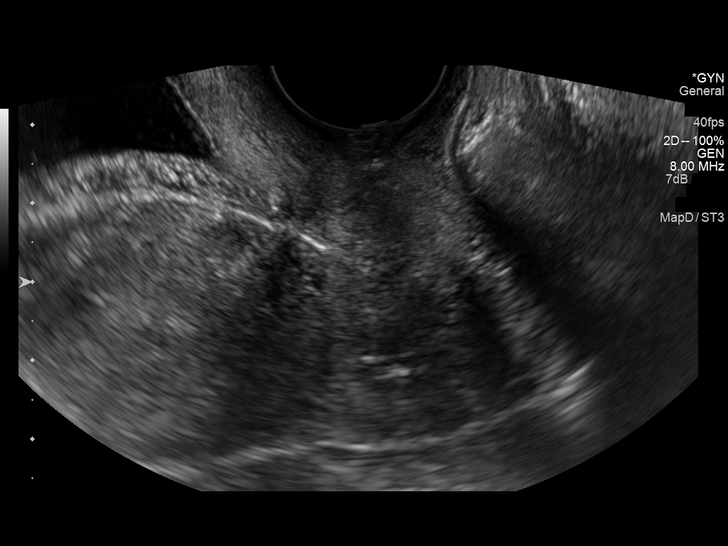

[14 of 25 positions shown; findings below may reference images not displayed]

FINDINGS: Uterus

Measurements: 6.3 x 3.6 x 3.4 cm. No fibroids or other mass
visualized.

Endometrium

Thickness: 5.8 mm which is within normal limits. No focal
abnormality visualized.

Right ovary

Measurements: 3.6 x 2.1 cm. Multiple follicular cysts are noted with
the largest measuring 1.6 cm.

Left ovary

Measurements: 4.5 x 2.7 x 2.6 cm. Multiple follicular cysts are
noted with the largest measuring 1.4 cm.

Other findings

No free fluid.
IMPRESSION: No significant abnormality seen in the pelvis.

## 2017-05-29 IMAGING — US US TRANSVAGINAL NON-OB
1 series · 14 of 25 positions shown · non-contrast
Comparison: 08/10/2014

CLINICAL DATA: Irregular menses

EXAM:
TRANSABDOMINAL AND TRANSVAGINAL ULTRASOUND OF PELVIS
TECHNIQUE: Both transabdominal and transvaginal ultrasound examinations of the
pelvis were performed. Transabdominal technique was performed for
global imaging of the pelvis including uterus, ovaries, adnexal
regions, and pelvic cul-de-sac. It was necessary to proceed with
endovaginal exam following the transabdominal exam to visualize the
endometrium and ovaries.

[Series 1: us transvaginal non-ob · 0.21mm/px · 14 of 72 slices shown]
[im 1/72]
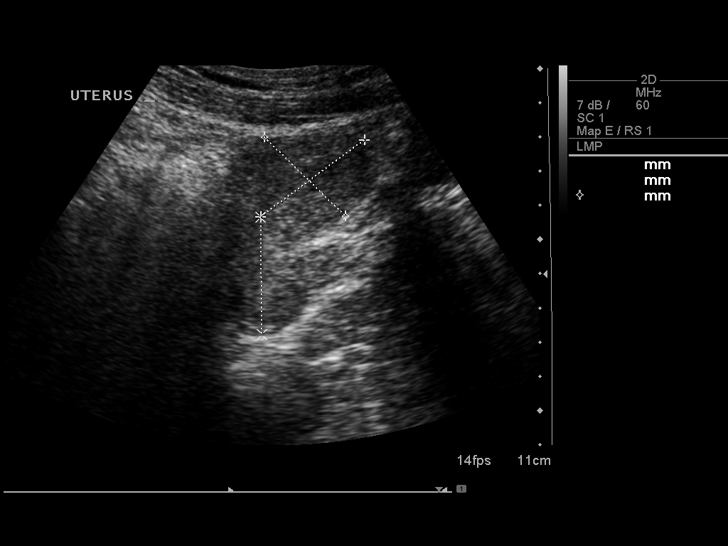
[im 6/72]
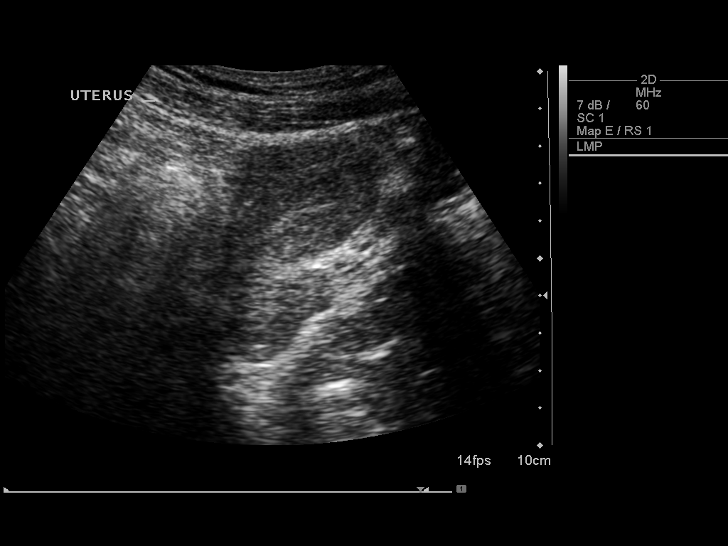
[im 12/72]
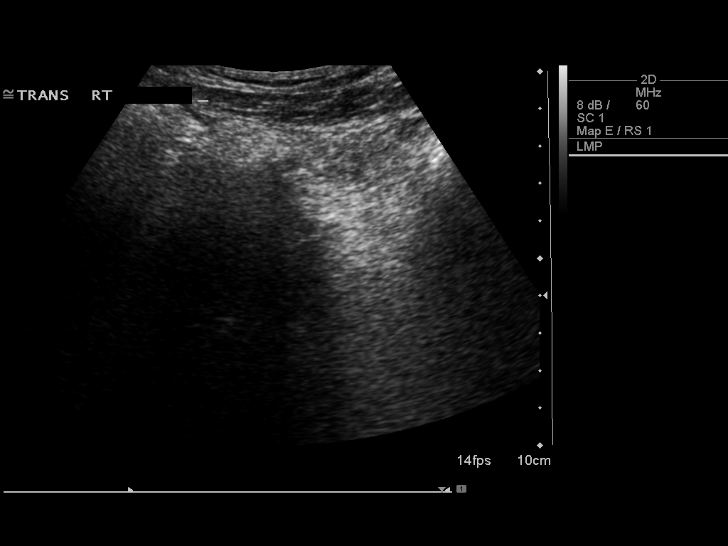
[im 18/72]
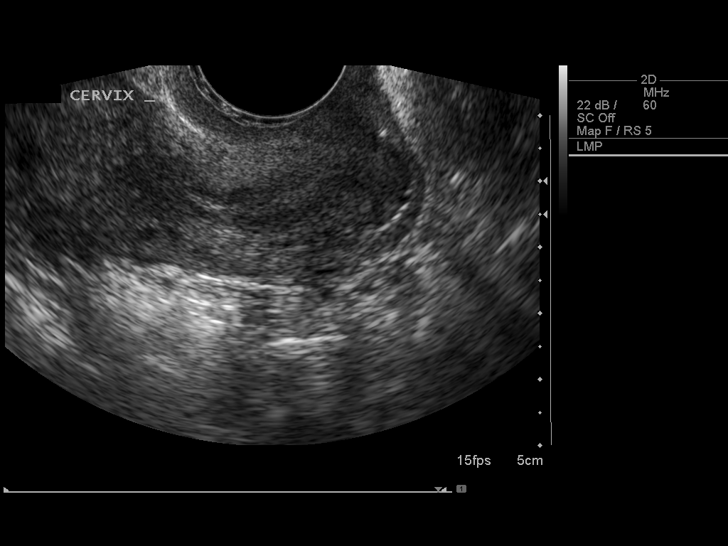
[im 24/72]
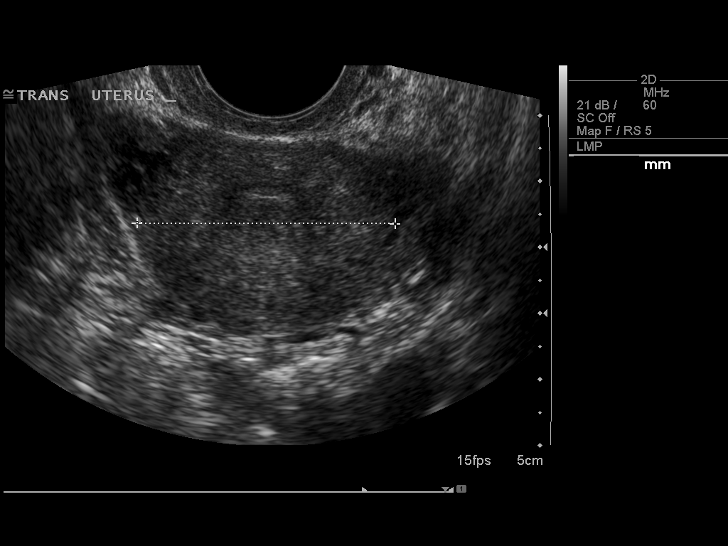
[im 27/72]
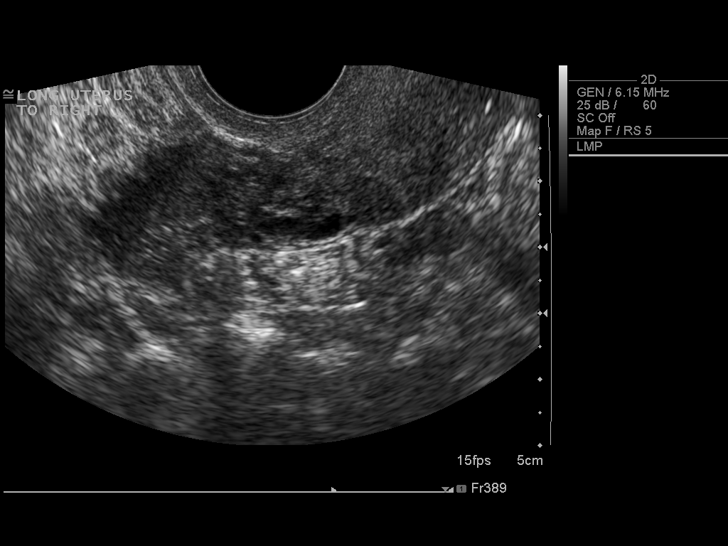
[im 33/72]
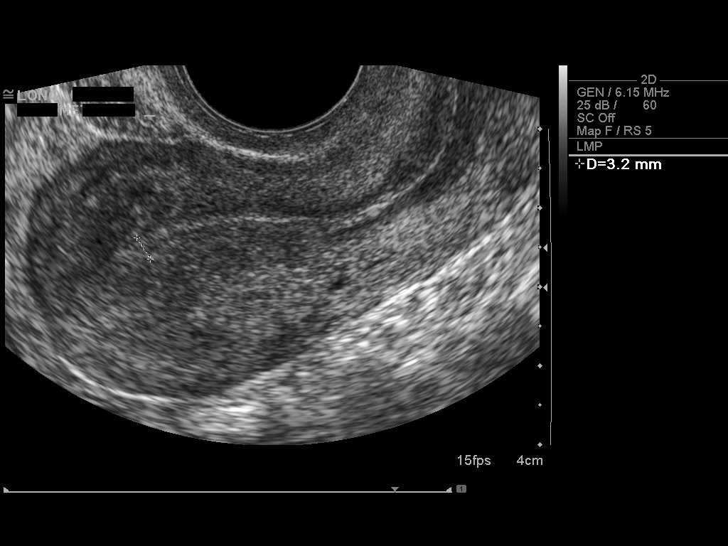
[im 39/72]
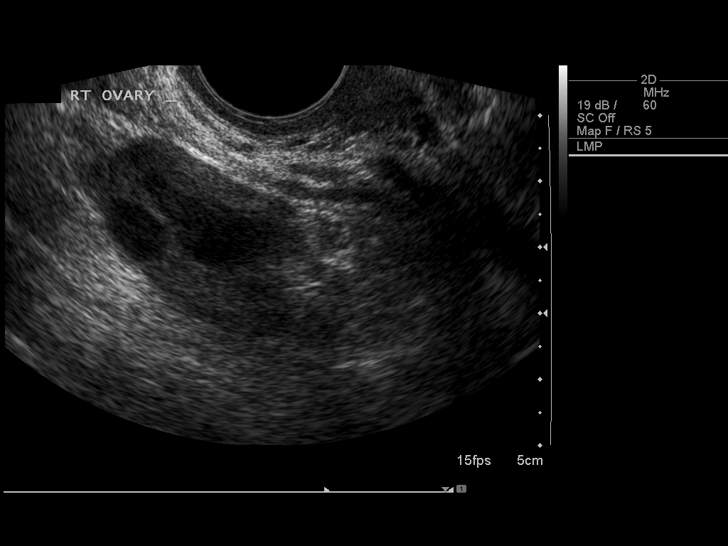
[im 45/72]
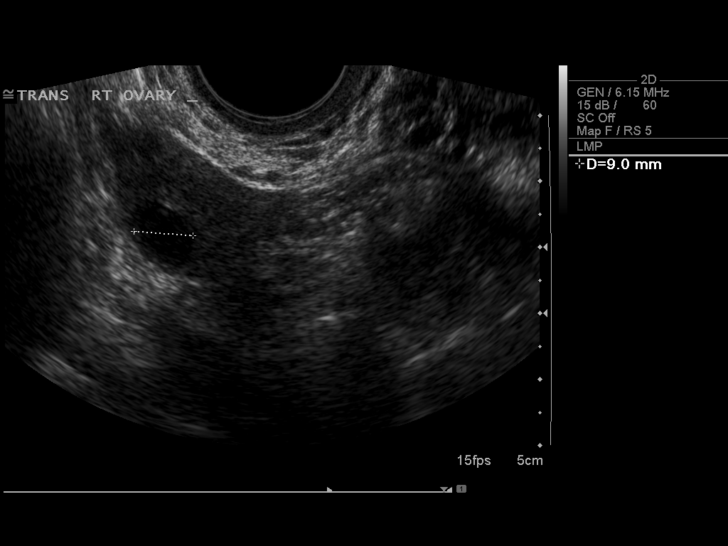
[im 48/72]
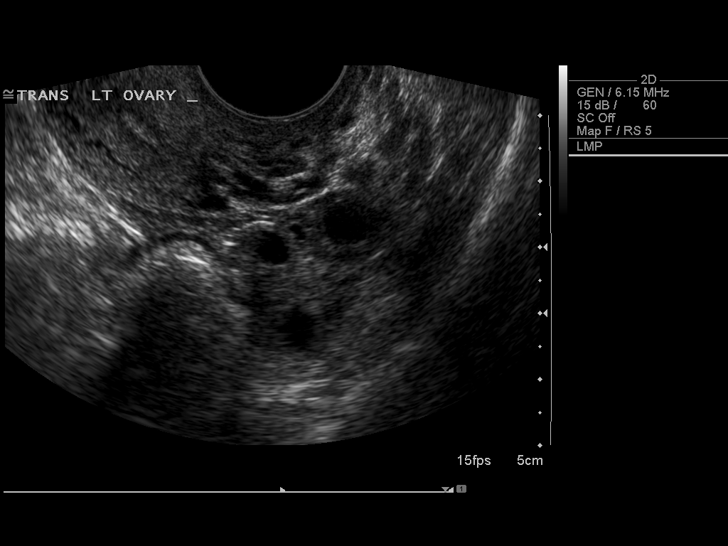
[im 54/72]
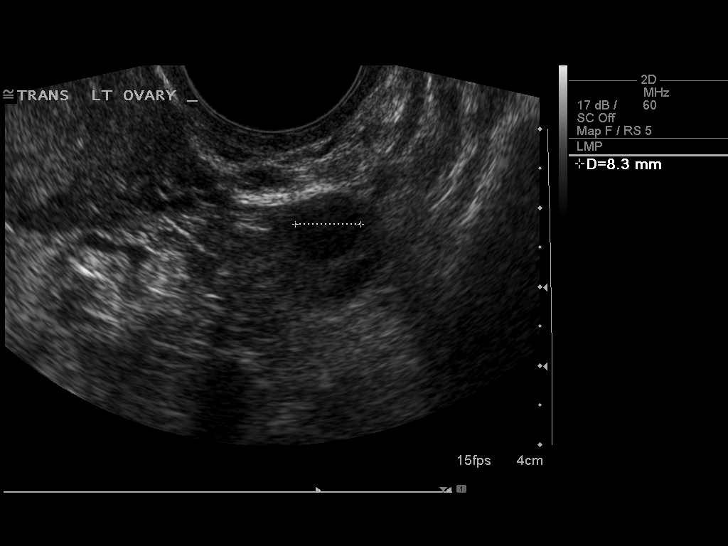
[im 60/72]
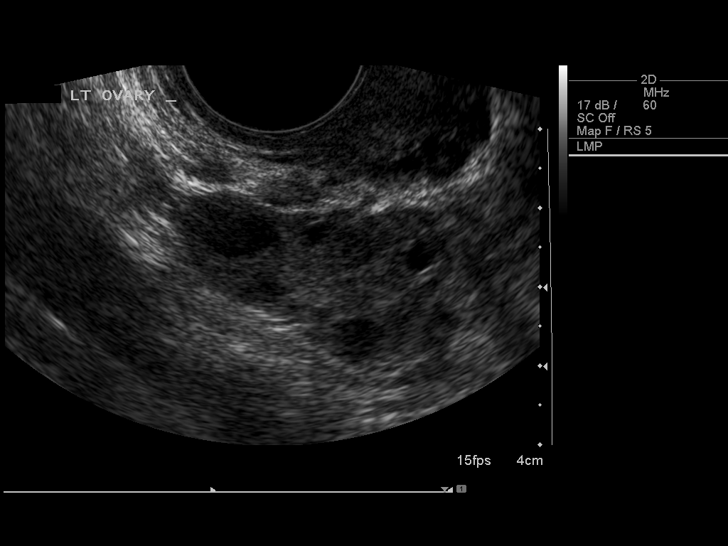
[im 66/72]
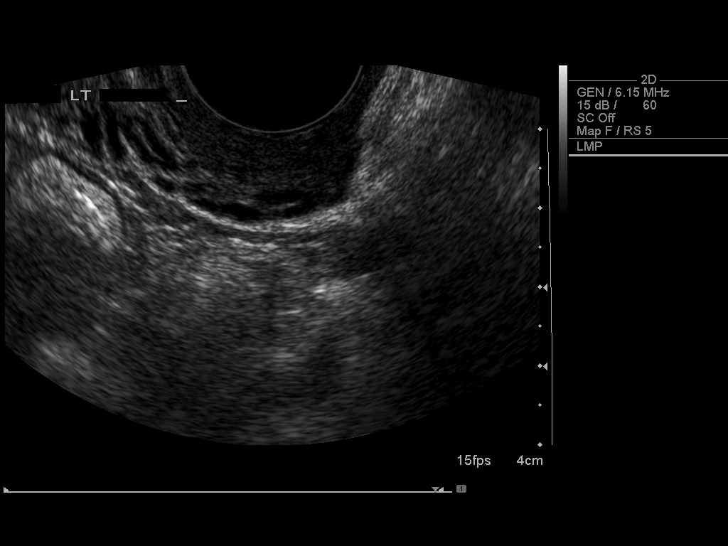
[im 72/72]
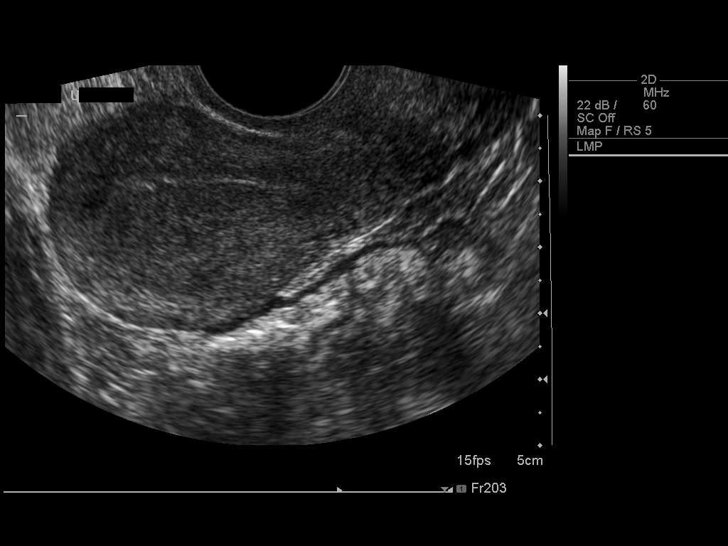

[14 of 25 positions shown; findings below may reference images not displayed]

FINDINGS: Uterus

Measurements: 7.2 x 3.3 x 4.8 cm. Small 1.2 cm fibroid in the right
posterior fundus, intramural.

Endometrium

Thickness: 3 mm in thickness.  No focal abnormality visualized.

Right ovary

Measurements: 3.8 x 2.3 x 2.3 cm. Dominant follicle measuring
cm. No adnexal or ovarian mass.

Left ovary

Measurements: 3.3 x 1.5 x 1.8 cm. Multiple small follicles. Normal
appearance/no adnexal mass.

Other findings

No abnormal free fluid.
IMPRESSION: 1.2 cm right fundal intramural fibroid.

No acute findings.

## 2017-07-21 IMAGING — CR DG CHEST 2V
2 series · 2 of 2 positions shown · non-contrast
Comparison: 11/10/2011

CLINICAL DATA: Coughing and shortness of breath for 3 days

EXAM:
CHEST  2 VIEW

[w chest pa]
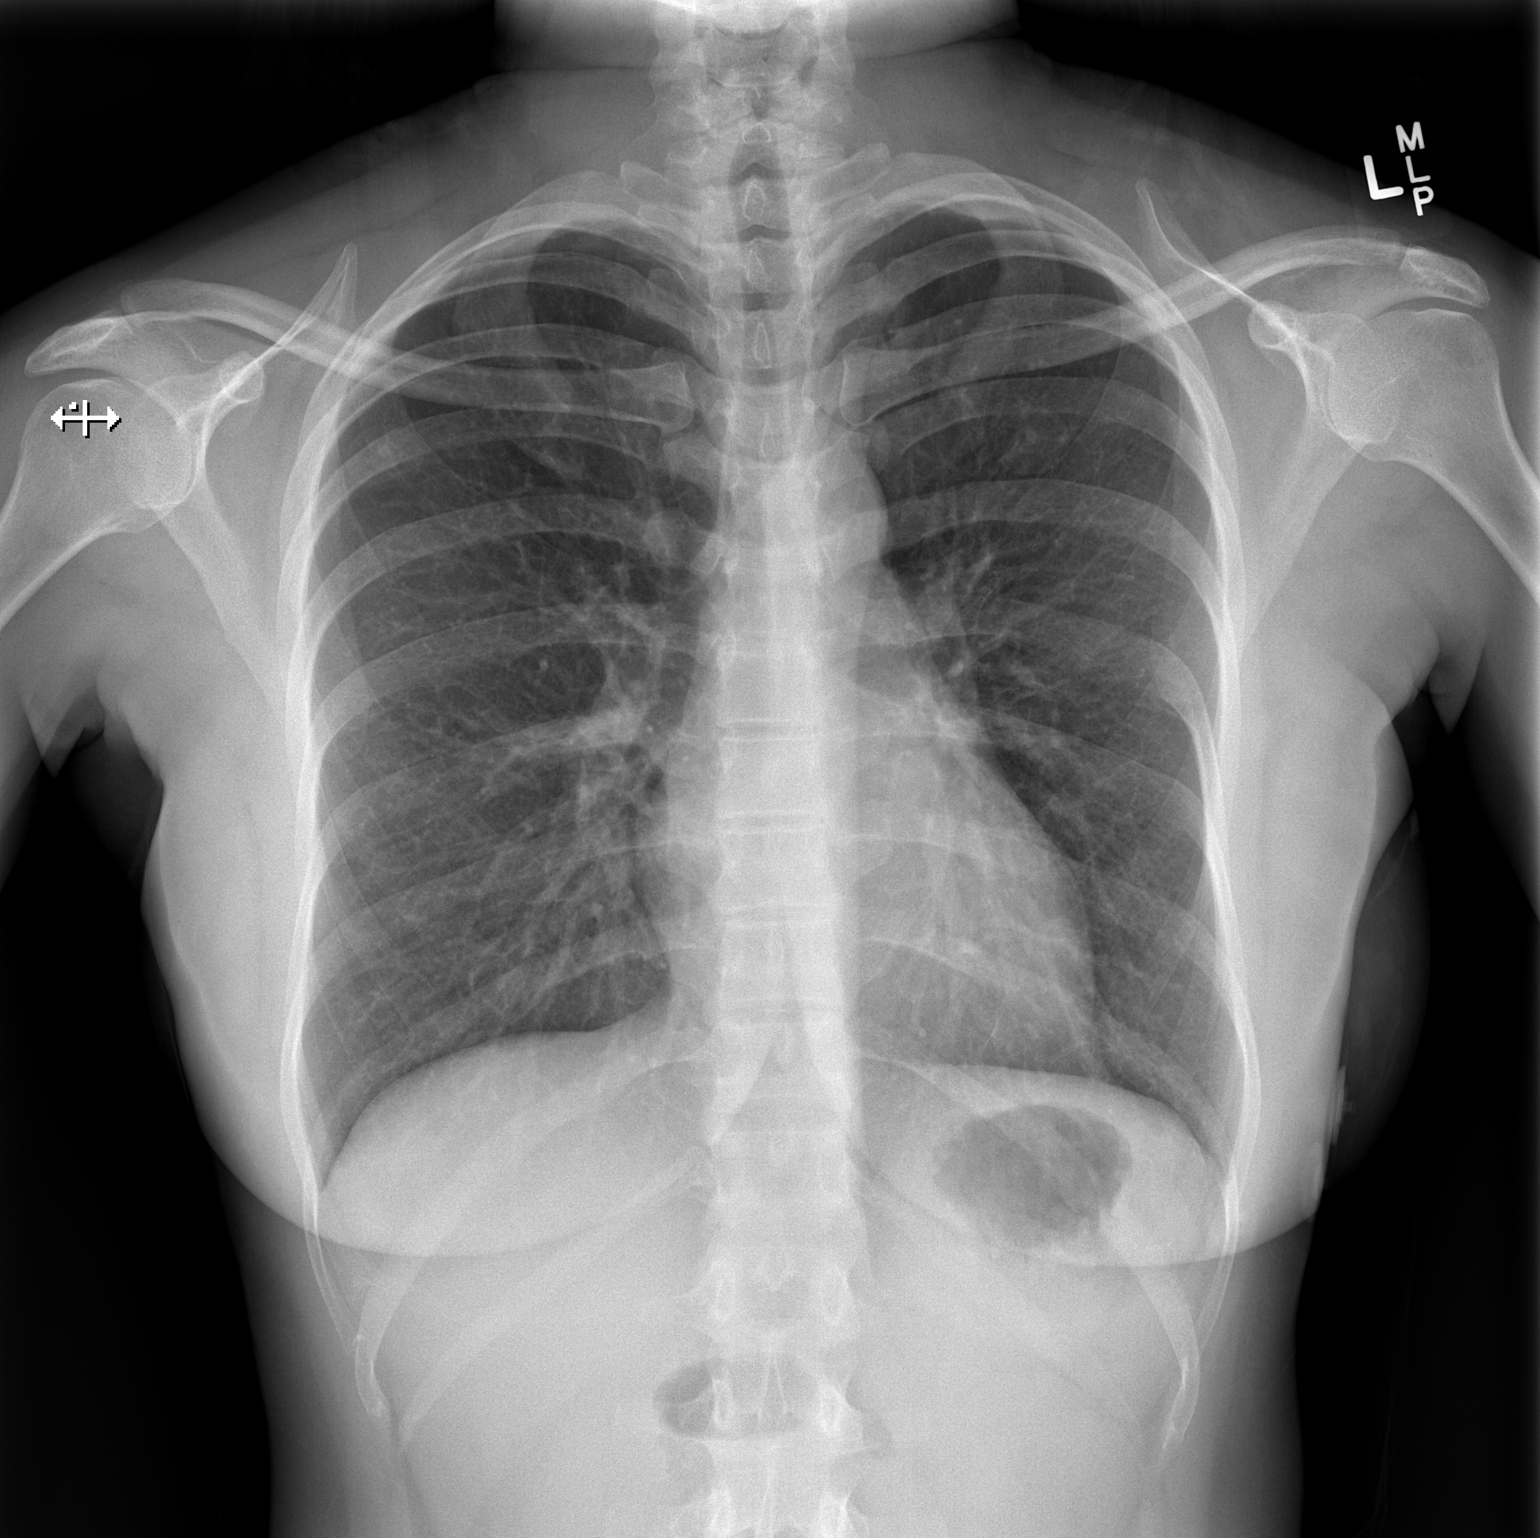

[w chest lat]
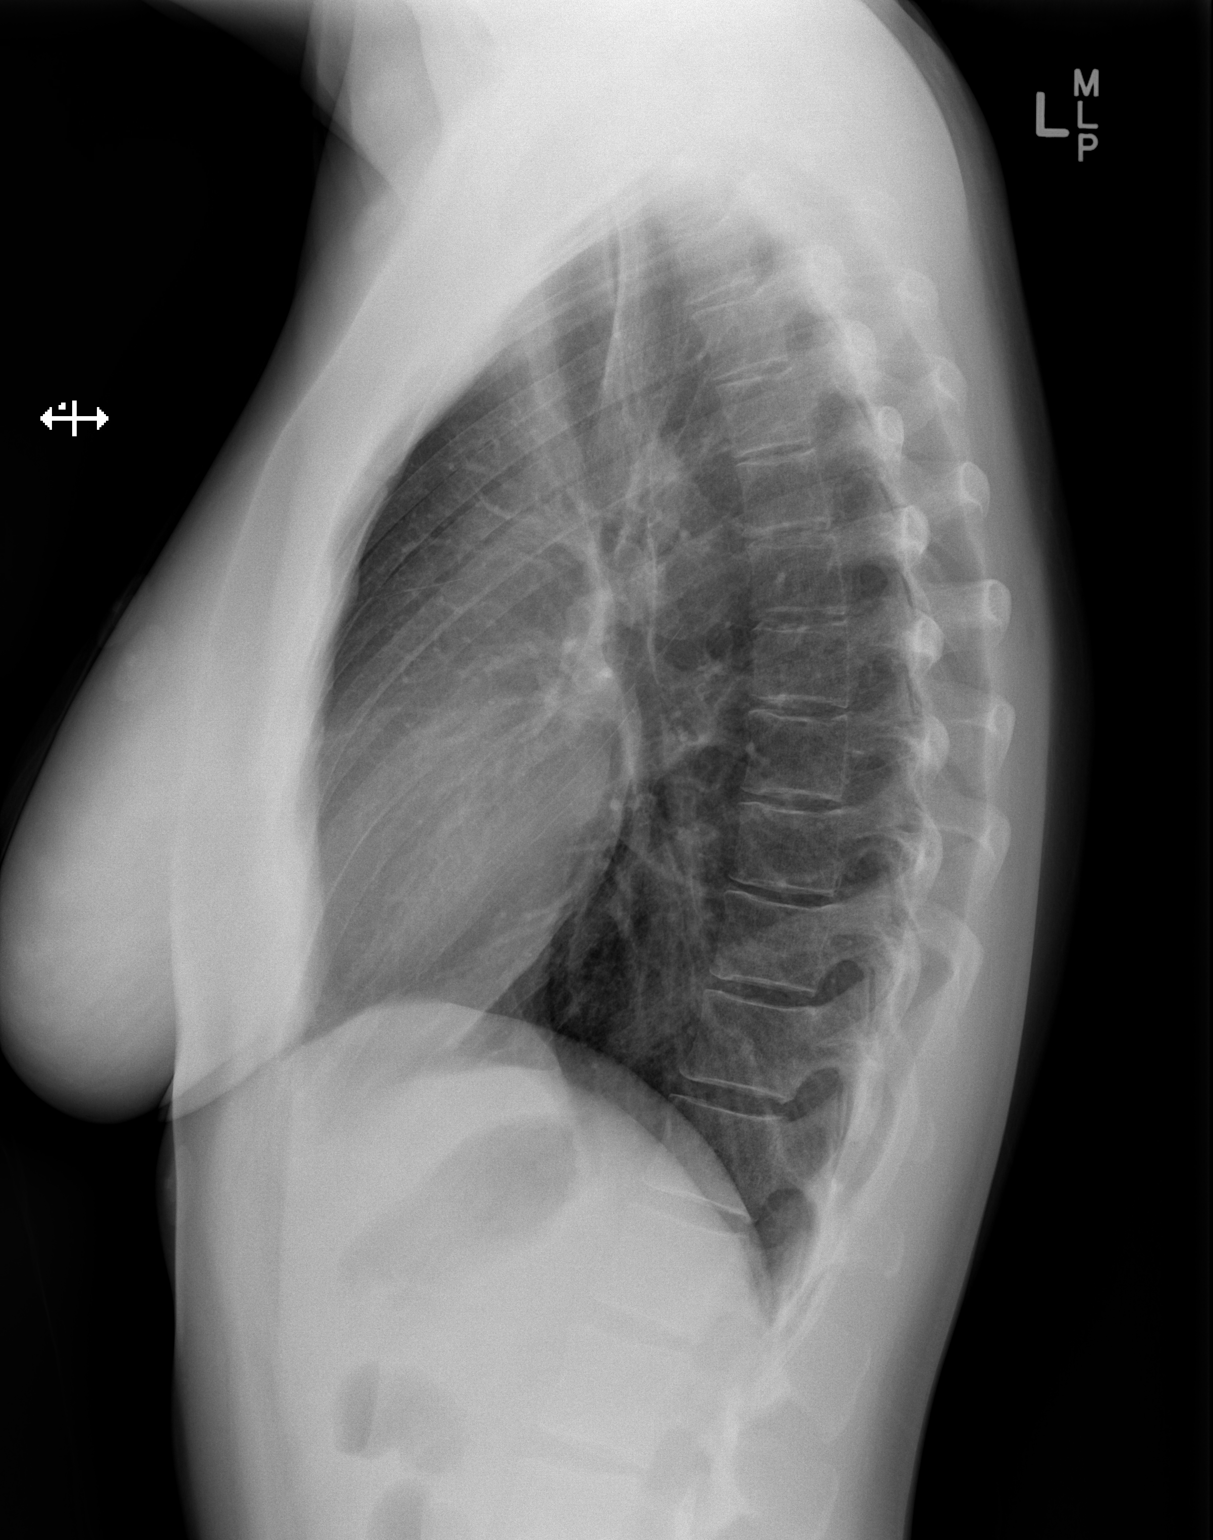

[2 of 2 positions shown; findings below may reference images not displayed]

FINDINGS: The heart size and mediastinal contours are within normal limits.
Both lungs are clear. The visualized skeletal structures are
unremarkable.
IMPRESSION: No active cardiopulmonary disease.
